# Patient Record
Sex: Female | Born: 1985
Health system: Southern US, Community
[De-identification: ages and names within clinical notes are randomized; demographics above are authoritative.]

## PROBLEM LIST (undated history)

## (undated) ENCOUNTER — Inpatient Hospital Stay (HOSPITAL_COMMUNITY): Payer: Self-pay

## (undated) DIAGNOSIS — F32A Depression, unspecified: Secondary | ICD-10-CM

## (undated) DIAGNOSIS — N83209 Unspecified ovarian cyst, unspecified side: Secondary | ICD-10-CM

## (undated) DIAGNOSIS — D649 Anemia, unspecified: Secondary | ICD-10-CM

## (undated) HISTORY — DX: Depression, unspecified: F32.A

## (undated) HISTORY — PX: NO PAST SURGERIES: SHX2092

---

## 2013-08-28 ENCOUNTER — Emergency Department: Payer: Self-pay | Admitting: Emergency Medicine

## 2015-08-05 ENCOUNTER — Ambulatory Visit: Payer: Self-pay | Admitting: Physician Assistant

## 2015-08-05 ENCOUNTER — Encounter: Payer: Self-pay | Admitting: Physician Assistant

## 2015-08-05 VITALS — BP 100/80 | HR 76 | Temp 98.2°F

## 2015-08-05 DIAGNOSIS — J018 Other acute sinusitis: Secondary | ICD-10-CM

## 2015-08-05 MED ORDER — AZITHROMYCIN 250 MG PO TABS: ORAL_TABLET | ORAL | Status: DC

## 2015-08-05 MED ORDER — FLUTICASONE PROPIONATE 50 MCG/ACT NA SUSP: 2.0000 | Freq: Every day | NASAL | Status: DC

## 2015-08-05 NOTE — Progress Notes (Signed)
S: C/o runny nose and congestion for 3 days, no fever, chills, cp/sob, v/d; mucus is yellow, boody, and thick, cough is sporadic, c/o of facial and dental pain.   Using otc meds:   O: PE: vitals wnl, nad, perrl eomi, normocephalic, tms dull, nasal mucosa red and swollen, throat injected, neck supple no lymph, lungs c t a, cv rrr, neuro intact  A:  Acute sinusitis   P: zpack, flonase, drink fluids, continue regular meds , use otc meds of choice, return if not improving in 5 days, return earlier if worsening

## 2015-09-06 DIAGNOSIS — Z6820 Body mass index (BMI) 20.0-20.9, adult: Secondary | ICD-10-CM | POA: Diagnosis not present

## 2015-09-06 DIAGNOSIS — F418 Other specified anxiety disorders: Secondary | ICD-10-CM | POA: Diagnosis not present

## 2015-10-29 DIAGNOSIS — F329 Major depressive disorder, single episode, unspecified: Secondary | ICD-10-CM | POA: Diagnosis not present

## 2015-10-29 DIAGNOSIS — G4700 Insomnia, unspecified: Secondary | ICD-10-CM | POA: Diagnosis not present

## 2015-10-29 DIAGNOSIS — Z681 Body mass index (BMI) 19 or less, adult: Secondary | ICD-10-CM | POA: Diagnosis not present

## 2015-10-29 DIAGNOSIS — F419 Anxiety disorder, unspecified: Secondary | ICD-10-CM | POA: Diagnosis not present

## 2016-02-09 DIAGNOSIS — Z8742 Personal history of other diseases of the female genital tract: Secondary | ICD-10-CM | POA: Diagnosis not present

## 2016-02-09 DIAGNOSIS — Z01419 Encounter for gynecological examination (general) (routine) without abnormal findings: Secondary | ICD-10-CM | POA: Diagnosis not present

## 2016-02-09 DIAGNOSIS — Z803 Family history of malignant neoplasm of breast: Secondary | ICD-10-CM | POA: Diagnosis not present

## 2016-02-09 DIAGNOSIS — F411 Generalized anxiety disorder: Secondary | ICD-10-CM | POA: Diagnosis not present

## 2016-02-09 DIAGNOSIS — Z30011 Encounter for initial prescription of contraceptive pills: Secondary | ICD-10-CM | POA: Diagnosis not present

## 2016-02-09 DIAGNOSIS — Z308 Encounter for other contraceptive management: Secondary | ICD-10-CM | POA: Diagnosis not present

## 2016-02-14 ENCOUNTER — Ambulatory Visit
Admission: RE | Admit: 2016-02-14 | Discharge: 2016-02-14 | Disposition: A | Discharge status: Home / Self Care | Payer: 59 | Source: Ambulatory Visit | Attending: Physician Assistant | Admitting: Physician Assistant

## 2016-02-14 ENCOUNTER — Ambulatory Visit: Payer: Self-pay | Admitting: Physician Assistant

## 2016-02-14 ENCOUNTER — Encounter: Payer: Self-pay | Admitting: Physician Assistant

## 2016-02-14 VITALS — BP 94/60 | HR 58 | Temp 98.2°F

## 2016-02-14 DIAGNOSIS — Z299 Encounter for prophylactic measures, unspecified: Secondary | ICD-10-CM

## 2016-02-14 DIAGNOSIS — R062 Wheezing: Secondary | ICD-10-CM | POA: Insufficient documentation

## 2016-02-14 LAB — POCT URINE PREGNANCY: Preg Test, Ur: NEGATIVE

## 2016-02-14 NOTE — Progress Notes (Signed)
S: wheezing, states her gyn doctor listened to her chest and said she had some wheezing in her lower left lung, denies cp/sob/ did have a cold about 2 weeks ago but no fever/chills at this time, ran 7 miles on Saturday, states she feels good, no hx of smoking, parents did not smoke, doesn't cough with laughter or extensive talking, no dif breathing, husband is very concerned  O: vitals wnl, nad, lungs c t a, cv rrr  A: hx of wheezing  P: urine preg in clinic, cxr ap and lateral

## 2016-02-24 DIAGNOSIS — R5383 Other fatigue: Secondary | ICD-10-CM | POA: Diagnosis not present

## 2016-02-24 DIAGNOSIS — R0609 Other forms of dyspnea: Secondary | ICD-10-CM | POA: Diagnosis not present

## 2016-02-24 DIAGNOSIS — Z6820 Body mass index (BMI) 20.0-20.9, adult: Secondary | ICD-10-CM | POA: Diagnosis not present

## 2016-02-24 DIAGNOSIS — R0689 Other abnormalities of breathing: Secondary | ICD-10-CM | POA: Diagnosis not present

## 2016-03-08 DIAGNOSIS — J45909 Unspecified asthma, uncomplicated: Secondary | ICD-10-CM | POA: Diagnosis not present

## 2016-03-08 DIAGNOSIS — Z681 Body mass index (BMI) 19 or less, adult: Secondary | ICD-10-CM | POA: Diagnosis not present

## 2016-03-20 NOTE — Progress Notes (Signed)
Patient was notified of normal cxr 02/16/2016

## 2016-09-14 DIAGNOSIS — H5203 Hypermetropia, bilateral: Secondary | ICD-10-CM | POA: Diagnosis not present

## 2017-01-02 ENCOUNTER — Ambulatory Visit: Payer: Self-pay | Admitting: Physician Assistant

## 2017-01-02 ENCOUNTER — Encounter: Payer: Self-pay | Admitting: Physician Assistant

## 2017-01-02 VITALS — BP 90/60 | HR 72 | Temp 97.9°F

## 2017-01-02 DIAGNOSIS — J069 Acute upper respiratory infection, unspecified: Secondary | ICD-10-CM

## 2017-01-02 MED ORDER — AZITHROMYCIN 250 MG PO TABS: ORAL_TABLET | ORAL | 0 refills | Status: DC

## 2017-01-02 MED ORDER — FLUTICASONE PROPIONATE 50 MCG/ACT NA SUSP: 2.0000 | Freq: Every day | NASAL | 6 refills | Status: DC

## 2017-01-02 NOTE — Progress Notes (Signed)
S: C/o runny nose and congestion and dry cough for 2 weeks, no fever, chills, but did feel warm last night; denies cp/sob, v/d; mucus is yellow  Using otc meds: dayqil/nyquil, cough drops  O: PE: vitals wnl, nad, perrl eomi, normocephalic, tms dull, nasal mucosa red and swollen, throat injected, neck supple no lymph, lungs c t a, cv rrr, neuro intact  A:  Acute  uri   P: drink fluids, continue regular meds , use otc meds of choice, return if not improving in 5 days, return earlier if worsening , zpack, flonase

## 2017-09-18 ENCOUNTER — Ambulatory Visit (INDEPENDENT_AMBULATORY_CARE_PROVIDER_SITE_OTHER): Payer: No Typology Code available for payment source | Admitting: Family Medicine

## 2017-09-18 ENCOUNTER — Encounter: Payer: Self-pay | Admitting: Family Medicine

## 2017-09-18 VITALS — BP 90/60 | HR 72 | Temp 97.6°F | Wt 129.4 lb

## 2017-09-18 DIAGNOSIS — Z862 Personal history of diseases of the blood and blood-forming organs and certain disorders involving the immune mechanism: Secondary | ICD-10-CM | POA: Diagnosis not present

## 2017-09-18 DIAGNOSIS — D509 Iron deficiency anemia, unspecified: Secondary | ICD-10-CM | POA: Diagnosis not present

## 2017-09-18 DIAGNOSIS — Z3169 Encounter for other general counseling and advice on procreation: Secondary | ICD-10-CM | POA: Diagnosis not present

## 2017-09-18 DIAGNOSIS — Z1159 Encounter for screening for other viral diseases: Secondary | ICD-10-CM | POA: Diagnosis not present

## 2017-09-18 NOTE — Progress Notes (Signed)
   CC: new patient  HPI  Referred by: [employer], [employer] employee. 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 Never tried IV drugs, experimented with other.  Anxiety, depression - currently using coping skills, no meds.  Eating disorder as a teenager. Purging.  - Diagnoses: Eating disorder, purging - Hospitalizations:  No  - Pharmacotherapy: can't recall, thinks zoloft - Outpatient therapy: in past, none now  Family history of psychiatric issues: none   Other: no trauma or violence (Consider trauma, interpersonal violence)   Surgical history: none  Multivitamin and iron occasionally - mostly vegan.  Last labs 1-2 years ago.   Social history:  Lives with: husband, coach of club soccer  Occupation: As above Tobacco use: tried as a teen Alcohol use: as above Drug use: as above  Sexual history:  Monogamous with husband Men in the past Never has any STIs  Birth control - regular periods, last OCP 1 year ago. Hx of bad cramps. Hx of normal PAPs. Last PAP within the last 3 years.   Mom w HER2 breast cancer   [facility] at [facility] 05/21/16.   ROS: Denies chest pain, shortness of breath, abdominal pain, dysuria, rash, change in bowel movements.  CC, SH/smoking status, and VS noted  Objective: BP 90/60 (BP Location: Right Arm, Patient Position: Sitting, Cuff Size: Normal)   Pulse 72   Temp 97.6 F (36.4 C) (Oral)   Wt 129 lb 6.4 oz (58.7 kg)   LMP 08/20/2017 (Approximate)   SpO2 98%  Gen: NAD, alert, cooperative, and pleasant thin female. HEENT: NCAT, EOMI, PERRL CV: RRR, no murmur Resp: CTAB, no wheezes, non-labored Abd: SNTND, BS present, no guarding or organomegaly Ext: No edema, warm Neuro: Alert and oriented, Speech clear, No gross deficits  Assessment and plan:  Encounter for preconception consultation Patient would desire  pregnancy within 1 year with her husband.  Has not been on birth control for 1 year, would not consider this past year as trying to get pregnant.  Counseled on beginning prenatal vitamins with folic acid as soon as possible, patient will do this.  Additionally, discussed vegan diet and no need to modify during pregnancy.  Suggested that patient return if pregnancy test is positive or she has other concerns.  History of iron deficiency anemia We will check CBC today, as patient reports history of iron deficiency anemia due to heavy menstruation in her past.   Orders Placed This Encounter  Procedures  . CBC  . HIV antibody    Ralene Ok, MD, PGY2 09/21/2017 11:58 AM

## 2017-09-18 NOTE — Patient Instructions (Addendum)
It was a pleasure to see you today! Thank you for choosing [facility] for your primary care. Allison King was seen for new patient appointment.   Our plans for today were:  Take your prenatal vitamin daily. It needs to have folic acid.   I will call you if your labs are abnormal.    If you become pregnant, we would be happy to see you here, or you are welcome to try another OB practice in town. I recommend the [facility] [employer] at [facility] or [address]   You should return to our clinic to see Dr. Chanetta Marshall in 1 year for physical or sooner if needed.   Best,  Dr. Chanetta Marshall   Preparing for Pregnancy If you are considering becoming pregnant, make an appointment to see your regular health care provider to learn how to prepare for a safe and healthy pregnancy (preconception care). During a preconception care visit, your health care provider will:  Do a complete physical exam, including a Pap test.  Take a complete medical history.  Give you information, answer your questions, and help you resolve problems.  Preconception checklist Medical history  Tell your health care provider about any current or past medical conditions. Your pregnancy or your ability to become pregnant may be affected by chronic conditions, such as diabetes, chronic hypertension, and thyroid problems.  Include your family's medical history as well as your partner's medical history.  Tell your health care provider about any history of STIs (sexually transmitted infections).These can affect your pregnancy. In some cases, they can be passed to your baby. Discuss any concerns that you have about STIs.  If indicated, discuss the benefits of genetic testing. This testing will show whether there are any genetic conditions that may be passed from you or your partner to your baby.  Tell your health care provider about: ? Any problems you have had with conception or  pregnancy. ? Any medicines you take. These include vitamins, herbal supplements, and over-the-counter medicines. ? Your history of immunizations. Discuss any vaccinations that you may need.  Diet  Ask your health care provider what to include in a healthy diet that has a balance of nutrients. This is especially important when you are pregnant or preparing to become pregnant.  Ask your health care provider to help you reach a healthy weight before pregnancy. ? If you are overweight, you may be at higher risk for certain complications, such as high blood pressure, diabetes, and preterm birth. ? If you are underweight, you are more likely to have a baby who has a low birth weight.  Lifestyle, work, and home  Let your health care provider know: ? About any lifestyle habits that you have, such as alcohol use, drug use, or smoking. ? About recreational activities that may put you at risk during pregnancy, such as downhill skiing and certain exercise programs. ? Tell your health care provider about any international travel, especially any travel to places with an active [place] virus outbreak. ? About harmful substances that you may be exposed to at work or at home. These include chemicals, pesticides, radiation, or even litter boxes. ? If you do not feel safe at home.  Mental health  Tell your health care provider about: ? Any history of mental health conditions, including feelings of depression, sadness, or anxiety. ? Any medicines that you take for a mental health condition. These include herbs and supplements.  Home instructions to prepare for pregnancy  Lifestyle  Eat a balanced diet. This includes fresh fruits and vegetables, whole grains, lean meats, low-fat dairy products, healthy fats, and foods that are high in fiber. Ask to meet with a nutritionist or registered dietitian for assistance with meal planning and goals.  Get regular exercise. Try to be active for at least 30 minutes a day  on most days of the week. Ask your health care provider which activities are safe during pregnancy.  Do not use any products that contain nicotine or tobacco, such as cigarettes and e-cigarettes. If you need help quitting, ask your health care provider.  Do not drink alcohol.  Do not take illegal drugs.  Maintain a healthy weight. Ask your health care provider what weight range is right for you.  General instructions  Keep an accurate record of your menstrual periods. This makes it easier for your health care provider to determine your baby's due date.  Begin taking prenatal vitamins and folic acid supplements daily as directed by your health care provider.  Manage any chronic conditions, such as high blood pressure and diabetes, as told by your health care provider. This is important.  How do I know that I am pregnant? You may be pregnant if you have been sexually active and you miss your period. Symptoms of early pregnancy include:  Mild cramping.  Very light vaginal bleeding (spotting).  Feeling unusually tired.  Nausea and vomiting (morning sickness).  If you have any of these symptoms and you suspect that you might be pregnant, you can take a home pregnancy test. These tests check for a hormone in your urine (human chorionic gonadotropin, or hCG). A woman's body begins to make this hormone during early pregnancy. These tests are very accurate. Wait until at least the first day after you miss your period to take one. If the test shows that you are pregnant (you get a positive result), call your health care provider to make an appointment for prenatal care. What should I do if I become pregnant?  Make an appointment with your health care provider as soon as you suspect you are pregnant.  Do not use any products that contain nicotine, such as cigarettes, chewing tobacco, and e-cigarettes. If you need help quitting, ask your health care provider.  Do not drink alcoholic  beverages. Alcohol is related to a number of birth defects.  Avoid toxic odors and chemicals.  You may continue to have sexual intercourse if it does not cause pain or other problems, such as vaginal bleeding. This information is not intended to replace advice given to you by your health care provider. Make sure you discuss any questions you have with your health care provider. Document Released: 07/20/2008 Document Revised: 04/04/2016 Document Reviewed: 02/27/2016 Elsevier Interactive Patient Education  [employer]2018 Elsevier Inc.

## 2017-09-19 LAB — CBC
HEMATOCRIT: 40.6 % (ref 34.0–46.6)
Hemoglobin: 12.9 g/dL (ref 11.1–15.9)
MCH: 30.4 pg (ref 26.6–33.0)
MCHC: 31.8 g/dL (ref 31.5–35.7)
MCV: 96 fL (ref 79–97)
Platelets: 226 10*3/uL (ref 150–379)
RBC: 4.24 x10E6/uL (ref 3.77–5.28)
RDW: 13.1 % (ref 12.3–15.4)
WBC: 7.3 10*3/uL (ref 3.4–10.8)

## 2017-09-19 LAB — HIV ANTIBODY (ROUTINE TESTING W REFLEX): HIV Screen 4th Generation wRfx: NONREACTIVE

## 2017-09-21 ENCOUNTER — Telehealth: Payer: Self-pay | Admitting: Family Medicine

## 2017-09-21 DIAGNOSIS — Z862 Personal history of diseases of the blood and blood-forming organs and certain disorders involving the immune mechanism: Secondary | ICD-10-CM | POA: Insufficient documentation

## 2017-09-21 DIAGNOSIS — Z3169 Encounter for other general counseling and advice on procreation: Secondary | ICD-10-CM | POA: Insufficient documentation

## 2017-09-21 NOTE — Telephone Encounter (Signed)
Called patient and left voicemail that labs are normal and she does not need any iron supplementation.  Left voicemail to call if questions.

## 2017-09-21 NOTE — Assessment & Plan Note (Signed)
Patient would desire pregnancy within 1 year with her husband.  Has not been on birth control for 1 year, would not consider this past year as trying to get pregnant.  Counseled on beginning prenatal vitamins with folic acid as soon as possible, patient will do this.  Additionally, discussed vegan diet and no need to modify during pregnancy.  Suggested that patient return if pregnancy test is positive or she has other concerns.

## 2017-09-21 NOTE — Assessment & Plan Note (Signed)
We will check CBC today, as patient reports history of iron deficiency anemia due to heavy menstruation in her past.

## 2017-12-03 ENCOUNTER — Emergency Department
Admission: EM | Admit: 2017-12-03 | Discharge: 2017-12-03 | Disposition: A | Discharge status: Home / Self Care | Payer: No Typology Code available for payment source | Attending: Emergency Medicine | Admitting: Emergency Medicine

## 2017-12-03 ENCOUNTER — Encounter: Payer: Self-pay | Admitting: Emergency Medicine

## 2017-12-03 ENCOUNTER — Other Ambulatory Visit: Payer: Self-pay

## 2017-12-03 DIAGNOSIS — R102 Pelvic and perineal pain: Secondary | ICD-10-CM | POA: Diagnosis not present

## 2017-12-03 DIAGNOSIS — Z79899 Other long term (current) drug therapy: Secondary | ICD-10-CM | POA: Diagnosis not present

## 2017-12-03 DIAGNOSIS — Z3202 Encounter for pregnancy test, result negative: Secondary | ICD-10-CM | POA: Diagnosis not present

## 2017-12-03 DIAGNOSIS — R1030 Lower abdominal pain, unspecified: Secondary | ICD-10-CM | POA: Diagnosis present

## 2017-12-03 HISTORY — DX: Unspecified ovarian cyst, unspecified side: N83.209

## 2017-12-03 LAB — URINALYSIS, COMPLETE (UACMP) WITH MICROSCOPIC
BACTERIA UA: NONE SEEN
BILIRUBIN URINE: NEGATIVE
Glucose, UA: NEGATIVE mg/dL
KETONES UR: NEGATIVE mg/dL
NITRITE: NEGATIVE
Protein, ur: 100 mg/dL — AB
SPECIFIC GRAVITY, URINE: 1.024 (ref 1.005–1.030)
pH: 8 (ref 5.0–8.0)

## 2017-12-03 LAB — COMPREHENSIVE METABOLIC PANEL
ALBUMIN: 4.4 g/dL (ref 3.5–5.0)
ALK PHOS: 105 U/L (ref 38–126)
ALT: 19 U/L (ref 14–54)
ANION GAP: 6 (ref 5–15)
AST: 29 U/L (ref 15–41)
BILIRUBIN TOTAL: 0.9 mg/dL (ref 0.3–1.2)
BUN: 15 mg/dL (ref 6–20)
CALCIUM: 8.7 mg/dL — AB (ref 8.9–10.3)
CO2: 27 mmol/L (ref 22–32)
Chloride: 104 mmol/L (ref 101–111)
Creatinine, Ser: 0.6 mg/dL (ref 0.44–1.00)
GLUCOSE: 108 mg/dL — AB (ref 65–99)
POTASSIUM: 4 mmol/L (ref 3.5–5.1)
Sodium: 137 mmol/L (ref 135–145)
TOTAL PROTEIN: 6.8 g/dL (ref 6.5–8.1)

## 2017-12-03 LAB — CBC
HEMATOCRIT: 39.4 % (ref 35.0–47.0)
HEMOGLOBIN: 13.6 g/dL (ref 12.0–16.0)
MCH: 32.4 pg (ref 26.0–34.0)
MCHC: 34.6 g/dL (ref 32.0–36.0)
MCV: 93.6 fL (ref 80.0–100.0)
Platelets: 202 10*3/uL (ref 150–440)
RBC: 4.21 MIL/uL (ref 3.80–5.20)
RDW: 12.9 % (ref 11.5–14.5)
WBC: 15 10*3/uL — AB (ref 3.6–11.0)

## 2017-12-03 LAB — POCT PREGNANCY, URINE: Preg Test, Ur: NEGATIVE

## 2017-12-03 LAB — LIPASE, BLOOD: Lipase: 35 U/L (ref 11–51)

## 2017-12-03 NOTE — Discharge Instructions (Addendum)
Please seek medical attention for any high fevers, chest pain, shortness of breath, change in behavior, persistent vomiting, bloody stool or any other new or concerning symptoms.  

## 2017-12-03 NOTE — ED Provider Notes (Signed)
[facility] Emergency Department Provider Note  ____________________________________________   I have reviewed the triage vital signs and the nursing notes.   HISTORY  Chief Complaint Abdominal Pain   History limited by: Not Limited   HPI Allison King is a 32 y.o. female who presents to the emergency department today because of concerns for lower abdominal pain.  Patient states the pain started suddenly.  It was severe.  Located in the bilateral lower abdomen.  It occurred while the patient was working out.  She denies doing any extraneous rectus exercises at that time.  She is currently menstruating and states she has had some normal cramping.  She was given medication by EMS and has had good resolution of the patient's symptoms.  She has had a similar symptoms in the past when she has had previous ovarian cyst.  She denies any recent illness.   Per medical record review patient has a history of ovarian cyst.  Past Medical History:  Diagnosis Date  . Ovarian cyst     Patient Active Problem List   Diagnosis Date Noted  . Encounter for preconception consultation 09/21/2017  . History of iron deficiency anemia 09/21/2017    History reviewed. No pertinent surgical history.  Prior to Admission medications   Medication Sig Start Date End Date Taking? Authorizing Provider  Prenatal Vit-Fe Fumarate-FA (PRENATAL MULTIVITAMIN) TABS tablet Take 1 tablet by mouth daily at 12 noon.    [provider]    Allergies Sulfa antibiotics  No family history on file.  Social History Social History   Tobacco Use  . Smoking status: Never Smoker  . Smokeless tobacco: Never Used  Substance Use Topics  . Alcohol use: Not on file  . Drug use: Not on file    Review of Systems Constitutional: No fever/chills Eyes: No visual changes. ENT: No sore throat. Cardiovascular: Denies chest pain. Respiratory: Denies shortness of  breath. Gastrointestinal: Positive for lower abdominal pain. Genitourinary: Negative for dysuria. Musculoskeletal: Negative for back pain. Skin: Negative for rash. Neurological: Negative for headaches, focal weakness or numbness.  ____________________________________________   PHYSICAL EXAM:  VITAL SIGNS: ED Triage Vitals  Enc Vitals Group     BP 12/03/17 1350 (!) 103/52     Pulse Rate 12/03/17 1350 71     Resp 12/03/17 1350 16     Temp 12/03/17 1350 98 F (36.7 C)     Temp Source 12/03/17 1350 Oral     SpO2 12/03/17 1350 100 %     Weight 12/03/17 1348 125 lb (56.7 kg)     Height 12/03/17 1348 5\' 5"  (1.651 m)     Head Circumference --      Peak Flow --      Pain Score 12/03/17 1348 0    Constitutional: Alert and oriented. Well appearing and in no distress. Eyes: Conjunctivae are normal.  ENT   Head: Normocephalic and atraumatic.   Nose: No congestion/rhinnorhea.   Mouth/Throat: Mucous membranes are moist.   Neck: No stridor. Hematological/Lymphatic/Immunilogical: No cervical lymphadenopathy. Cardiovascular: Normal rate, regular rhythm.  No murmurs, rubs, or gallops.  Respiratory: Normal respiratory effort without tachypnea nor retractions. Breath sounds are clear and equal bilaterally. No wheezes/rales/rhonchi. Gastrointestinal: Soft and non tender. No rebound. No guarding.  Genitourinary: Deferred Musculoskeletal: Normal range of motion in all extremities.  Neurologic:  Normal speech and language. No gross focal neurologic deficits are appreciated.  Skin:  Skin is warm, dry and intact. No rash noted. Psychiatric: Mood and affect  are normal. Speech and behavior are normal. Patient exhibits appropriate insight and judgment.  ____________________________________________    LABS (pertinent positives/negatives)  Upreg negative UA too numerous to count RBC, wbc 0-5, trace leukocytes Lipase 35 CMP na 137, k 4.0, cr 0.60 CBC wbc 15.0, hgb 13.6, plt  202 ____________________________________________   EKG  None  ____________________________________________    RADIOLOGY  None  ____________________________________________   PROCEDURES  Procedures  ____________________________________________   INITIAL IMPRESSION / ASSESSMENT AND PLAN / ED COURSE  Pertinent labs & imaging results that were available during my care of the patient were reviewed by me and considered in my medical decision making (see chart for details).  Patient presented to the emergency department today with concerns for lower abdominal pain that started while she was exercised.  At the time my examination the pain had resolved.  Abdomen was nontender.  States she has a history of ovarian cyst in the past and is currently menstruating.  Did discuss possibility of ovarian cyst the patient.  Also discussed possibility of ovarian torsion.  Did offer ultrasound however patient deferred at this time.  I think this is reasonable given resolution of the patient's symptoms and benign exam.  We did discuss return precautions.  ______________________________________   FINAL CLINICAL IMPRESSION(S) / ED DIAGNOSES  Final diagnoses:  Pelvic pain in female     Note: This dictation was prepared with Dragon dictation. Any transcriptional errors that result from this process are unintentional     Phineas SemenGoodman, Tanajah Boulter, MD 12/03/17 1544

## 2017-12-03 NOTE — ED Triage Notes (Signed)
C/O vaginal bleeding and lower abdominal pain, onset of symptoms this morning.  Patient states she has had similar symptoms when she had a ruptured ovarian cyst in the past.  Last about 5 years ago.  ARrived by EMS and was given fentanyl and zofran, which has resolved pain.

## 2017-12-03 NOTE — ED Notes (Signed)
Pain bilat low abd today while working out.  Sharp.  Feels like cysts felt in past.  Menses now.  Allison King is about a 1 now.  Got meds on ambulance.

## 2017-12-05 ENCOUNTER — Encounter: Payer: Self-pay | Admitting: Emergency Medicine

## 2018-01-09 ENCOUNTER — Ambulatory Visit (INDEPENDENT_AMBULATORY_CARE_PROVIDER_SITE_OTHER): Payer: No Typology Code available for payment source | Admitting: Obstetrics & Gynecology

## 2018-01-09 ENCOUNTER — Ambulatory Visit (INDEPENDENT_AMBULATORY_CARE_PROVIDER_SITE_OTHER): Payer: No Typology Code available for payment source | Admitting: Clinical

## 2018-01-09 ENCOUNTER — Encounter: Payer: Self-pay | Admitting: Obstetrics & Gynecology

## 2018-01-09 VITALS — BP 101/67 | HR 56 | Resp 16 | Ht 65.0 in | Wt 125.8 lb

## 2018-01-09 DIAGNOSIS — Z1151 Encounter for screening for human papillomavirus (HPV): Secondary | ICD-10-CM

## 2018-01-09 DIAGNOSIS — Z124 Encounter for screening for malignant neoplasm of cervix: Secondary | ICD-10-CM

## 2018-01-09 DIAGNOSIS — N941 Unspecified dyspareunia: Secondary | ICD-10-CM | POA: Diagnosis not present

## 2018-01-09 DIAGNOSIS — Z113 Encounter for screening for infections with a predominantly sexual mode of transmission: Secondary | ICD-10-CM

## 2018-01-09 DIAGNOSIS — Z3202 Encounter for pregnancy test, result negative: Secondary | ICD-10-CM

## 2018-01-09 DIAGNOSIS — F4322 Adjustment disorder with anxiety: Secondary | ICD-10-CM | POA: Diagnosis not present

## 2018-01-09 DIAGNOSIS — N926 Irregular menstruation, unspecified: Secondary | ICD-10-CM

## 2018-01-09 DIAGNOSIS — B373 Candidiasis of vulva and vagina: Secondary | ICD-10-CM | POA: Diagnosis not present

## 2018-01-09 DIAGNOSIS — F331 Major depressive disorder, recurrent, moderate: Secondary | ICD-10-CM | POA: Diagnosis not present

## 2018-01-09 DIAGNOSIS — B3731 Acute candidiasis of vulva and vagina: Secondary | ICD-10-CM

## 2018-01-09 LAB — POCT URINE PREGNANCY: Preg Test, Ur: NEGATIVE

## 2018-01-09 NOTE — Patient Instructions (Signed)
Return to clinic for any scheduled appointments or for any gynecologic concerns as needed.   

## 2018-01-09 NOTE — BH Specialist Note (Signed)
Integrated Behavioral Health Initial Visit  MRN: [id number] Name: Tyriana Helmkamp  Number of Integrated Behavioral Health Clinician visits:: 1/6 Session Start time: 3:10  Session End time: 3:33 Total time: 20 minutes  Type of Service: Integrated Behavioral Health- Individual/Family Interpretor:No. Interpretor Name and Language: n/a   Warm Hand Off Completed.       SUBJECTIVE: Paylin Hailu is a 32 y.o. female accompanied by n/a Patient was referred by Dr Macon Large for anxiety. Patient reports the following symptoms/concerns: Pt states her primary concern today is anxiety and lack of quality sleep, attributed to adjusting to a new location(next to sound of train), new work position, and goal of pregnancy; pt uses daily meditation and physical exercise to cope.  Duration of problem: Ongoing; Severity of problem: moderate  OBJECTIVE: Mood: Anxious and Affect: Appropriate Risk of harm to self or others: No plan to harm self or others  LIFE CONTEXT: Family and Social: Pt lives with husband. School/Work: Pt works fulltime as Stage manager: Daily meditation and physical exercise Life Changes: Move to new location; new work position  GOALS ADDRESSED: Patient will: 1. Reduce symptoms of: anxiety 2. Increase knowledge and/or ability of: self-management skills  3. Demonstrate ability to: Increase healthy adjustment to current life circumstances  INTERVENTIONS: Interventions utilized: Mindfulness or Management consultant and Psychoeducation and/or Health Education  Standardized Assessments completed: GAD-7 and PHQ 9  ASSESSMENT: Patient currently experiencing Adjustment disorder with anxious mood.   Patient may benefit from psychoeducation and brief therapeutic interventions regarding coping with symptoms of anxiety .  PLAN: 1. Follow up with behavioral health clinician on : As needed 2. Behavioral recommendations:  -Continue using daily meditation and  physical exercise; consider sleep and meditation apps as additional self-coping strategies -Consider returning to EACP services  -Read educational materials regarding coping with symptoms of anxiety  3. Referral(s): Integrated [employer] (In Clinic) 4. "From scale of 1-10, how likely are you to follow plan?": 10  Rae Lips, LCSW   Depression screen [facility] 2/9 01/09/2018 09/18/2017  Decreased Interest 1 0  Down, Depressed, Hopeless 2 0  PHQ - 2 Score 3 0  Altered sleeping 2 -  Tired, decreased energy 2 -  Change in appetite 0 -  Feeling bad or failure about yourself  0 -  Trouble concentrating 2 -  Moving slowly or fidgety/restless 0 -  Suicidal thoughts 0 -  PHQ-9 Score 9 -  Difficult doing work/chores Somewhat difficult -   GAD 7 : Generalized Anxiety Score 01/09/2018  Nervous, Anxious, on Edge 3  Control/stop worrying 3  Worry too much - different things 3  Trouble relaxing 3  Restless 2  Easily annoyed or irritable 1  Afraid - awful might happen 0  Total GAD 7 Score 15  Anxiety Difficulty Extremely difficult

## 2018-01-09 NOTE — Progress Notes (Signed)
GYNECOLOGY OFFICE VISIT NOTE  History:  32 y.o. G0 here today for evaluation of irregular menstrual periods.  Last months, had two periods two weeks apart. Associated with nausea in the last two weeks. Feels very tired, depressed, low; history of depression exacerbated in last month.  Occasionally has dyspareunia.  Negative UPT at home.  She denies any abnormal vaginal discharge, bleeding, pelvic pain or other concerns.   Past Medical History:  Diagnosis Date  . Ovarian cyst     History reviewed. No pertinent surgical history.  The following portions of the patient's history were reviewed and updated as appropriate: allergies, current medications, past family history, past medical history, past social history, past surgical history and problem list.   Health Maintenance:  Normal pap in the last 3 years.   Review of Systems:  Pertinent items noted in HPI and remainder of comprehensive ROS otherwise negative.  Objective:  Physical Exam BP 101/67 (BP Location: Left Arm, Patient Position: Sitting, Cuff Size: Large)   Pulse (!) 56   Resp 16   Ht  (1.651 m)   Wt 125 lb 12.8 oz (57.1 kg)   LMP 12/26/2017 (Exact Date)   BMI 20.93 kg/m  CONSTITUTIONAL: Well-developed, well-nourished female in no acute distress.  HENT:  Normocephalic, atraumatic. External right and left ear normal. Oropharynx is clear and moist EYES: Conjunctivae and EOM are normal. Pupils are equal, round, and reactive to light. No scleral icterus.  NECK: Normal range of motion, supple, no masses SKIN: Skin is warm and dry. No rash noted. Not diaphoretic. No erythema. No pallor. NEUROLOGIC: Alert and oriented to person, place, and time. Normal reflexes, muscle tone coordination. No cranial nerve deficit noted. PSYCHIATRIC: Normal mood and affect. Normal behavior. Normal judgment and thought content. CARDIOVASCULAR: Normal heart rate noted RESPIRATORY: Effort and breath sounds normal, no problems with respiration  noted ABDOMEN: Soft, no distention noted.   PELVIC: Normal appearing external genitalia; normal appearing vaginal mucosa and cervix.  No abnormal discharge noted. Pap smear done.  Normal uterine size, no other palpable masses, no uterine or adnexal tenderness. MUSCULOSKELETAL: Normal range of motion. No edema noted.  Labs and Imaging Results for orders placed or performed in visit on 01/09/18 (from the past 24 hour(s))  POCT urine pregnancy     Status: Normal   Collection Time: 01/09/18  2:43 PM  Result Value Ref Range   Preg Test, Ur Negative Negative     Assessment & Plan:  1. Irregular menses Patient has abnormal uterine bleeding . She has a normal exam, no evidence of lesions.  Will order abnormal uterine bleeding evaluation labs and pelvic ultrasound to evaluate for any structural gynecologic abnormalities.  Will contact patient with these results and plans for further evaluation/management. - POCT urine pregnancy - CBC - TSH - US PELVIC COMPLETE WITH TRANSVAGINAL; Future - Cervicovaginal ancillary only - Cytology - PAP  2. Dyspareunia in female - US PELVIC COMPLETE WITH TRANSVAGINAL; Future - Cervicovaginal ancillary only  3. Moderate episode of recurrent major depressive disorder [facility]) Patient verbally consented to meet with [facility] Clinician about presenting concerns. Will follow up recommendations. - Amb ref to Integrated Behavioral [facility]   Routine preventative health maintenance measures emphasized. Please refer to After Visit Summary for other counseling recommendations.   Return if symptoms worsen or fail to improve.   Total face-to-face time with patient: 20 minutes.  Over 50% of encounter was spent on counseling and coordination of care.   Jaynie Collins,  MD, [place], [facility] for [employer], [place]

## 2018-01-10 ENCOUNTER — Telehealth: Payer: Self-pay | Admitting: *Deleted

## 2018-01-10 LAB — TSH: TSH: 1.3 u[IU]/mL (ref 0.450–4.500)

## 2018-01-10 LAB — CBC
HEMATOCRIT: 39.1 % (ref 34.0–46.6)
Hemoglobin: 13.2 g/dL (ref 11.1–15.9)
MCH: 31.4 pg (ref 26.6–33.0)
MCHC: 33.8 g/dL (ref 31.5–35.7)
MCV: 93 fL (ref 79–97)
Platelets: 251 10*3/uL (ref 150–450)
RBC: 4.21 x10E6/uL (ref 3.77–5.28)
RDW: 13 % (ref 12.3–15.4)
WBC: 6.7 10*3/uL (ref 3.4–10.8)

## 2018-01-10 LAB — CERVICOVAGINAL ANCILLARY ONLY
BACTERIAL VAGINITIS: NEGATIVE
Candida vaginitis: POSITIVE — AB
Chlamydia: NEGATIVE
Neisseria Gonorrhea: NEGATIVE
TRICH (WINDOWPATH): NEGATIVE

## 2018-01-10 MED ORDER — FLUCONAZOLE 150 MG PO TABS: 150.0000 mg | ORAL_TABLET | Freq: Once | ORAL | 3 refills | Status: AC

## 2018-01-10 NOTE — Telephone Encounter (Signed)
-----   Message from Tereso Newcomer, MD sent at 01/10/2018  2:56 PM EDT ----- Vaginal discharge test is abnormal and showed yeast infection. Diflucan was prescribed. Please inform patient of results and advise to pick up prescription.

## 2018-01-10 NOTE — Addendum Note (Signed)
Addended by: Jaynie Collins A on: 01/10/2018 02:56 PM   Modules accepted: Orders

## 2018-01-10 NOTE — Telephone Encounter (Signed)
I called Allison King and left a message I am calling with some non urgent results - please call our office during office hours.

## 2018-01-11 ENCOUNTER — Telehealth: Payer: Self-pay | Admitting: Clinical

## 2018-01-11 LAB — CYTOLOGY - PAP
Diagnosis: NEGATIVE
HPV: NOT DETECTED

## 2018-01-11 NOTE — Telephone Encounter (Signed)
Follow-up with patient, as agreed-upon by pt and [facility], to notify pt of additional local support to match patient needs.

## 2018-01-15 NOTE — Telephone Encounter (Signed)
Mychart message sent to patient regarding result and recommendation.

## 2018-01-17 ENCOUNTER — Encounter: Payer: Self-pay | Admitting: Obstetrics & Gynecology

## 2018-01-17 ENCOUNTER — Ambulatory Visit (HOSPITAL_COMMUNITY)
Admission: RE | Admit: 2018-01-17 | Discharge: 2018-01-17 | Disposition: A | Discharge status: Home / Self Care | Payer: No Typology Code available for payment source | Source: Ambulatory Visit | Attending: Obstetrics & Gynecology | Admitting: Obstetrics & Gynecology

## 2018-01-17 DIAGNOSIS — N941 Unspecified dyspareunia: Secondary | ICD-10-CM | POA: Insufficient documentation

## 2018-01-17 DIAGNOSIS — N926 Irregular menstruation, unspecified: Secondary | ICD-10-CM | POA: Diagnosis not present

## 2018-01-21 ENCOUNTER — Ambulatory Visit: Payer: Self-pay

## 2018-01-22 ENCOUNTER — Ambulatory Visit (INDEPENDENT_AMBULATORY_CARE_PROVIDER_SITE_OTHER): Payer: No Typology Code available for payment source

## 2018-01-22 DIAGNOSIS — Z23 Encounter for immunization: Secondary | ICD-10-CM | POA: Diagnosis not present

## 2018-01-22 NOTE — Progress Notes (Signed)
   Patient in to nurse clinic for TDaP vaccine. Vaccine given right deltoid. Patient tolerated well.  Ples SpecterAlisa Misty Foutz, RN [facility] RN)

## 2018-04-17 ENCOUNTER — Ambulatory Visit (INDEPENDENT_AMBULATORY_CARE_PROVIDER_SITE_OTHER): Payer: No Typology Code available for payment source | Admitting: Family Medicine

## 2018-04-17 ENCOUNTER — Ambulatory Visit (HOSPITAL_COMMUNITY)
Admission: RE | Admit: 2018-04-17 | Discharge: 2018-04-17 | Disposition: A | Discharge status: Home / Self Care | Payer: No Typology Code available for payment source | Source: Ambulatory Visit | Attending: Family Medicine | Admitting: Family Medicine

## 2018-04-17 ENCOUNTER — Other Ambulatory Visit: Payer: Self-pay

## 2018-04-17 VITALS — BP 102/60 | HR 64 | Temp 98.7°F | Ht 65.0 in | Wt 124.0 lb

## 2018-04-17 DIAGNOSIS — M79672 Pain in left foot: Secondary | ICD-10-CM | POA: Insufficient documentation

## 2018-04-17 NOTE — Patient Instructions (Signed)
It was great seeing you today! We have addressed the following issues today  1. We will do an Xray of your foot today and I will follow up on the results 2. Make an appointment to follow up with Dr.Timberlake in two weeks for reevaluation. 3. Continue conservative management with rest, ice, and ibuprofen as needed.  If we did any lab work today, and the results require attention, either me or my nurse will get in touch with you. If everything is normal, you will get a letter in mail and a message via . If you don't hear from us in two weeks, please give us a call. Otherwise, we look forward to seeing you again at your next visit. If you have any questions or concerns before then, please call the clinic at [phone].  Please bring all your medications to every doctors visit  Sign up for My Chart to have easy access to your labs results, and communication with your Primary care physician. Please ask Front Desk for some assistance.   Please check-out at the front desk before leaving the clinic.    Take Care,   Dr. Sydnee Cabaliallo

## 2018-04-17 NOTE — Progress Notes (Signed)
   Subjective:    Patient ID: Allison King, female    DOB: Apr 10, 1986, 32 y.o.   MRN: [id number]   CC: Left foot pain   HPI: Patient is a 32 yo female who presents today complaining of left foot pain for the pas three weeks. Patient reports she first started to experience left foot pain while running mostly located on the plantar aspect of her forefoot and on the lateral aspect around the fifth MTP. Patient is an avid runner about 20 miles a week. Patient denies any tenderness to her ankle of medial aspect of her foot. No trauma and injury. She reports she just bought new insoles as her previous ones were worn out. She rated her pain 7/10 three weeks ago but it has gradually improve with rest ( two and half weeks), icing and ibuprofen as needed. No pain while walking, no pain with first step in the morning. Denies any swelling but describes the initial feeling as " having to pop something in my foot" on the dorsal aspect around the 5th metatarsal.   Smoking status reviewed   ROS: all other systems were reviewed and are negative other than in the HPI   Past Medical History:  Diagnosis Date  . Ovarian cyst     History reviewed. No pertinent surgical history.  Past medical history, surgical, family, and social history reviewed and updated in the EMR as appropriate.  Objective:  BP 102/60   Pulse 64   Temp 98.7 F (37.1 C) (Oral)   Ht 5\' 5"  (1.651 m)   Wt 124 lb (56.2 kg)   SpO2 98%   BMI 20.63 kg/m   Vitals and nursing note reviewed  General: NAD, pleasant, able to participate in exam Cardiac: RRR, normal heart sounds, no murmurs. 2+ radial and PT pulses bilaterally Respiratory: CTAB, normal effort, No wheezes, rales or rhonchi Abdomen: soft, nontender, nondistended, no hepatic or splenomegaly, +BS Left Foot Exam: No swelling, deformities, erythema noted on exam. Mildly tender to palpation around the fifth MTP and plantar aspect between toes 3 and 4. Normal dorsi and  plantar flexion. Sensation is intact. Good pulses. Ankle with good range of motion Right foot exam within normal limits Skin: warm and dry, no rashes noted Neuro: alert and oriented x4, no focal deficits Psych: Normal affect and mood   Assessment & Plan:   Left foot pain Patient presents with left foot pain mostly on the lateral aspect of her foot with also some tenderness noted on the plantar surface between toes 3-4. Pain is most noticeable while running around the fifth MTP. No nodule palpated on the plantar that would be concerning for morton neuroma. Possible stress fracture, unlikely to be lisfranc though she reports some dorsal tenderness initially. No pain with ambulation, do not think patient need immobilization of her foot. Symptoms not consistent with plantar fasciitis.   --Order foot X ray 3 views. --Continue rest, ice and NSAIDs as needed.  --If she resume running, better insole, flatter surface, gradual increase.  --Follow up in two weeks for reevaluation.   Lovena NeighboursAbdoulaye Alessandria Henken, MD [facility] PGY-3

## 2018-04-17 NOTE — Assessment & Plan Note (Addendum)
Patient presents with left foot pain mostly on the lateral aspect of her foot with also some tenderness noted on the plantar surface between toes 3-4. Pain is most noticeable while running around the fifth MTP. No nodule palpated on the plantar that would be concerning for morton neuroma. Possible stress fracture, unlikely to be lisfranc though she reports some dorsal tenderness initially. No pain with ambulation, do not think patient need immobilization of her foot. Symptoms not consistent with plantar fasciitis.   --Order foot X ray 3 views. --Continue rest, ice and NSAIDs as needed.  --If she resume running, better insole, flatter surface, gradual increase.  --Follow up in two weeks for reevaluation.

## 2018-05-10 ENCOUNTER — Ambulatory Visit: Payer: Self-pay | Admitting: Family Medicine

## 2018-09-06 ENCOUNTER — Encounter: Payer: Self-pay | Admitting: Family Medicine

## 2018-09-06 ENCOUNTER — Other Ambulatory Visit: Payer: Self-pay

## 2018-09-06 ENCOUNTER — Ambulatory Visit (INDEPENDENT_AMBULATORY_CARE_PROVIDER_SITE_OTHER): Payer: No Typology Code available for payment source | Admitting: Family Medicine

## 2018-09-06 VITALS — BP 95/58 | HR 63 | Temp 97.9°F | Wt 123.0 lb

## 2018-09-06 DIAGNOSIS — F411 Generalized anxiety disorder: Secondary | ICD-10-CM

## 2018-09-06 DIAGNOSIS — F322 Major depressive disorder, single episode, severe without psychotic features: Secondary | ICD-10-CM | POA: Insufficient documentation

## 2018-09-06 MED ORDER — NORTRIPTYLINE HCL 10 MG PO CAPS: 10.0000 mg | ORAL_CAPSULE | Freq: Every day | ORAL | 1 refills | Status: DC

## 2018-09-06 MED FILL — NORTRIPTYLINE HCL 10 MG CAP: 10 | 30 days supply | Qty: 30 | Fill #0

## 2018-09-06 NOTE — Progress Notes (Signed)
Subjective:    Allison King - 33 y.o. female MRN [id number]  Date of birth: [birth year]  CC:  Allison King is here to discuss her depression and anxiety.  HPI: Depression and anxiety - has been feeling depressed for the last 8 months, but has noticed an impact on her work her during the last month - has had trouble with sleep, which is the main reason why she is here - sees a therapist, which is very helpful for her - also has a strong support network including a very supportive partner and empathetic friends - has tried trazodone in the past when she was a teenager and cannot say whether this was very helpful for her since she was struggling with drug and alcohol addiction at that time - uses exercise to cope, but this is difficult when she cannot sleep as well - nervous to start medications, especially long-term, daily medications that will take a while to have an effect, and 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 INFLUENZA VACCINE  03/21/2018    -  reports that she has never smoked. She has never used smokeless tobacco. - Review of Systems: Per HPI. - Past Medical History: Patient Active Problem List   Diagnosis Date Noted  . Current severe episode of major depressive disorder without psychotic features without prior episode (HCC) 09/06/2018  . GAD (generalized anxiety disorder) 09/06/2018  . History of iron deficiency anemia 09/21/2017   - Medications: reviewed and updated   Objective:   Physical Exam BP (!) 95/58   Pulse 63   Temp 97.9  F (36.6 C) (Oral)   Wt 123 lb (55.8 kg)   LMP 07/23/2018   SpO2 99%   BMI 20.47 kg/m  Gen: NAD, alert, cooperative with exam, well-appearing HEENT: NCAT, PERRL, clear conjunctiva, oropharynx clear, supple neck CV: RRR, good S1/S2, no murmur, no edema, capillary refill brisk  Resp: CTABL, no wheezes, non-labored Abd: SNTND, BS present, no guarding or organomegaly Skin: no rashes, normal turgor  Neuro: no gross deficits.  Psych: good insight, alert and oriented  Depression screen PHQ 2/9 09/06/2018  Decreased Interest 3  Down, Depressed, Hopeless 3  PHQ - 2 Score 6  Altered sleeping 3  Tired, decreased energy 3  Change in appetite 2  Feeling bad or failure about yourself  3  Trouble concentrating 3  Moving slowly or fidgety/restless 2  Suicidal thoughts 1  PHQ-9 Score 23  Difficult doing work/chores Very difficult   GAD 7 : Generalized Anxiety Score 09/06/2018 01/09/2018  Nervous, Anxious, on Edge 3 3  Control/stop worrying 3 3  Worry too much - different things 3 3  Trouble relaxing 3 3  Restless 3 2  Easily annoyed or irritable 3 1  Afraid - awful might happen 0 0  Total GAD 7 Score 18 15  Anxiety Difficulty Very difficult Extremely difficult          Assessment & Plan:   Current severe episode of major depressive disorder without psychotic features without  prior episode [facility]) Patient is currently doing several lifestyle modifications and seeing a therapist for her depression, but it remains uncontrolled.  Patient is requesting a medicine to help her sleep for a short duration of time, so we agreed to try nortriptyline 10 mg nightly.  This is the lowest dose of this medication, so I told patient that we can increase this dose if she needs in the future.  Asked patient to consider an SSRI in the future as well.  Spoke at length with her regarding her suicidal thoughts, but she does not have a plan and does not think she would ever make a plan, so this was not an  immediate concern.  GAD (generalized anxiety disorder) Please see above plan.  Hopefully, nortriptyline will improve her sleep and she will be able to better cope with her anxiety due to improved sleep quality.  She may benefit from an SSRI in the future.    Lezlie Octave, M.D. 09/06/2018, 4:49 PM PGY-2, [facility]

## 2018-09-06 NOTE — Patient Instructions (Addendum)
It was nice meeting you today Ms. Bryant-Pardini!  We are going to start a medication called nortriptyline to help with your sleep.  We are starting the lowest dose, so if it does not seem to be helpful, please call me.  Please continue to see your therapist and get exercise.  Good luck with your job transition.  If you have any questions or concerns, please feel free to call the clinic.   Be well,  Dr. Frances Furbish

## 2018-09-06 NOTE — Assessment & Plan Note (Signed)
Please see above plan.  Hopefully, nortriptyline will improve her sleep and she will be able to better cope with her anxiety due to improved sleep quality.  She may benefit from an SSRI in the future.

## 2018-09-06 NOTE — Assessment & Plan Note (Signed)
Patient is currently doing several lifestyle modifications and seeing a therapist for her depression, but it remains uncontrolled.  Patient is requesting a medicine to help her sleep for a short duration of time, so we agreed to try nortriptyline 10 mg nightly.  This is the lowest dose of this medication, so I told patient that we can increase this dose if she needs in the future.  Asked patient to consider an SSRI in the future as well.  Spoke at length with her regarding her suicidal thoughts, but she does not have a plan and does not think she would ever make a plan, so this was not an immediate concern.

## 2018-09-25 ENCOUNTER — Ambulatory Visit (INDEPENDENT_AMBULATORY_CARE_PROVIDER_SITE_OTHER): Payer: No Typology Code available for payment source | Admitting: Family Medicine

## 2018-09-25 ENCOUNTER — Encounter: Payer: Self-pay | Admitting: Family Medicine

## 2018-09-25 ENCOUNTER — Other Ambulatory Visit: Payer: Self-pay

## 2018-09-25 VITALS — BP 98/60 | HR 92 | Temp 98.3°F | Ht 65.0 in | Wt 125.2 lb

## 2018-09-25 DIAGNOSIS — G479 Sleep disorder, unspecified: Secondary | ICD-10-CM | POA: Diagnosis not present

## 2018-09-25 DIAGNOSIS — F3341 Major depressive disorder, recurrent, in partial remission: Secondary | ICD-10-CM | POA: Diagnosis not present

## 2018-09-25 MED ORDER — HYDROXYZINE HCL 25 MG PO TABS: 25.0000 mg | ORAL_TABLET | Freq: Every evening | ORAL | 0 refills | Status: DC | PRN

## 2018-09-25 NOTE — Progress Notes (Signed)
   CC: mood  HPI  Mood - picked up the TCA. Tried it for 2 nights. Felt some nausea the fist night,. She was not in favor of the antidepressant idea of this. Has been trying readying before bed. Consistent time. Trying metalton which has helped some. Still wakes up at 2-3am most days. Is able to fall back asleep after this though. Exercising int eh mroning. Has been having some more work stress. She is changing jobs which she feels is helping. Husband is helping. Has been on zoloft and prozac as a teen. Feels like she was numb on SSRIs 4-5 years ago. Seeing [facility].     Office Visit from 09/25/2018 in [facility]  PHQ-9 Total Score  12     GAD 7 : Generalized Anxiety Score 09/25/2018 09/06/2018 01/09/2018  Nervous, Anxious, on Edge 2 3 3   Control/stop worrying 2 3 3   Worry too much - different things 2 3 3   Trouble relaxing 2 3 3   Restless 1 3 2   Easily annoyed or irritable 2 3 1   Afraid - awful might happen 0 0 0  Total GAD 7 Score 11 18 15   Anxiety Difficulty Very difficult Very difficult Extremely difficult      ROS: Denies CP, SOB, abdominal pain, dysuria, changes in BMs.   CC, SH/smoking status, and VS noted  Objective: BP 98/60   Pulse 92   Temp 98.3 F (36.8 C) (Oral)   Ht 5\' 5"  (1.651 m)   Wt 125 lb 3.2 oz (56.8 kg)   LMP 09/10/2018 (Exact Date)   SpO2 97%   BMI 20.83 kg/m  Gen: NAD, alert, cooperative, and pleasant. HEENT: NCAT, EOMI, PERRL CV: RRR, no murmur Resp: CTAB, no wheezes, non-labored Ext: No edema, warm Neuro: Alert and oriented, Speech clear, No gross deficits  Assessment and plan:  Depression -definitely feels that she is getting benefit from counseling, would love her to continue this.  We had a long discussion about various different types of mood medicines and the ones that she has previously not done well on which include Prozac, sertraline, Lexapro.  She is quite hesitant to start a "antidepressant," but I counseled her that the  fact that she has needed mood treatment off and on for the last 10 years or so and it seems as though her mood is impairing her daily function, she would be a great candidate for a mood medication.  She is also concerned about her sleep, which I report to her may improve if her depression is fully treated.  In the short-term, we will try Vistaril for sleep nightly as needed, and we will also continue to discuss mood medicines.  My next choice for her would be venlafaxine, and I gave her information about this particular medicine to consider.  She also states that she will talk to her counselor this week about what may be giving her a mental hang up about starting an antidepressant.  She notes continued passive suicidal ideation, which has become less common over the last month.  She knows to reach out if she has any escalation in the symptoms.  Loni Muse, MD, PGY3 09/25/2018 9:48 AM

## 2018-09-25 NOTE — Patient Instructions (Signed)
It was a pleasure to see you today! Thank you for choosing [facility] for your primary care. Allison King was seen for mood, sleep.   Our plans for today were:  You will ask your therapist about what might be causing your hesitation with starting mood medicine.   Try the vistaril for sleep.   Consider a medicine called venlafaxine or effexor for mood in the future.    Best,  Dr. Chanetta Marshall

## 2018-11-15 ENCOUNTER — Telehealth: Payer: Self-pay | Admitting: Family Medicine

## 2018-11-15 NOTE — Telephone Encounter (Signed)
Agree with plan 

## 2018-11-15 NOTE — Telephone Encounter (Signed)
Patient called for concerns of SOB. Stated that symptoms began 1 week ago with SOB with exertion. States that she is normally very athletic, can run 3 miles without any difficulty, but now has been having difficulty even running 1 mile and has now noticed SOB with walking up a flight of stairs. Patient's spouse told her she should call in, patient does not like to see doctors if possible. Patient denies fever or cough. Does report a chest tightness when SOB occurs. Denies edema. Otherwise healthy without significant health problems. FHx significant for breast cancer in her mother. Denies wheezing. Denies sick contacts. No recent travel. No birth control. Is not sedentary. Over the phone patient is speaking full sentences without distress, however, she is not exerting herself.   Given that patient is normally very athletic with acute change in respiratory status I believe patient needs to be evaluated today. Unfortunately we are unable to obtain outpatient CXR at this present time or provide COVID testing. Recommended that patient go to ED to have further testing and will recommend CXR and COVID testing on arrival to ED.   Discussed this with patient. She is agreeable to go to ED.   Precepted with Dr. Lavetta Nielsen, DO, PGY-2 [facility] 11/15/2018 11:49 AM

## 2019-02-04 ENCOUNTER — Telehealth: Payer: Self-pay

## 2019-02-04 DIAGNOSIS — Z20822 Contact with and (suspected) exposure to covid-19: Secondary | ICD-10-CM

## 2019-02-04 NOTE — Telephone Encounter (Signed)
Pt returned call and scheduled per Dr Rachell Cipro request. Order placed.

## 2019-02-04 NOTE — Telephone Encounter (Signed)
[place] with Dr. Rachell Cipro, called, requests COVID 19 test due to exposure. Left message to call back. Office # [phone]. Fax# [phone].

## 2019-02-05 ENCOUNTER — Other Ambulatory Visit: Payer: Self-pay

## 2019-02-05 DIAGNOSIS — Z20822 Contact with and (suspected) exposure to covid-19: Secondary | ICD-10-CM

## 2019-02-08 LAB — NOVEL CORONAVIRUS, NAA: SARS-CoV-2, NAA: NOT DETECTED

## 2019-05-23 ENCOUNTER — Other Ambulatory Visit: Payer: Self-pay

## 2019-05-23 DIAGNOSIS — Z20822 Contact with and (suspected) exposure to covid-19: Secondary | ICD-10-CM

## 2019-05-24 LAB — NOVEL CORONAVIRUS, NAA: SARS-CoV-2, NAA: NOT DETECTED

## 2020-03-05 ENCOUNTER — Other Ambulatory Visit: Payer: Self-pay

## 2020-03-05 ENCOUNTER — Encounter (HOSPITAL_COMMUNITY): Payer: Self-pay

## 2020-03-05 DIAGNOSIS — O200 Threatened abortion: Secondary | ICD-10-CM | POA: Insufficient documentation

## 2020-03-05 DIAGNOSIS — Z3A08 8 weeks gestation of pregnancy: Secondary | ICD-10-CM | POA: Insufficient documentation

## 2020-03-05 DIAGNOSIS — O209 Hemorrhage in early pregnancy, unspecified: Secondary | ICD-10-CM | POA: Diagnosis present

## 2020-03-05 NOTE — ED Triage Notes (Signed)
Pt c/o spotting and cramping starting yesterday afternoon. Pt states she is [redacted] weeks pregnant. Pt c/o increased bleeding and cramping today, pt bleeding through pants in triage and stated there were clots in her underwear and she believes she has miscarried.

## 2020-03-06 ENCOUNTER — Emergency Department (HOSPITAL_COMMUNITY)
Admission: EM | Admit: 2020-03-06 | Discharge: 2020-03-06 | Disposition: A | Discharge status: Home / Self Care | Payer: Managed Care, Other (non HMO) | Attending: Emergency Medicine | Admitting: Emergency Medicine

## 2020-03-06 ENCOUNTER — Emergency Department (HOSPITAL_COMMUNITY): Payer: Managed Care, Other (non HMO)

## 2020-03-06 DIAGNOSIS — O200 Threatened abortion: Secondary | ICD-10-CM

## 2020-03-06 LAB — COMPREHENSIVE METABOLIC PANEL
ALT: 21 U/L (ref 0–44)
AST: 24 U/L (ref 15–41)
Albumin: 4.5 g/dL (ref 3.5–5.0)
Alkaline Phosphatase: 85 U/L (ref 38–126)
Anion gap: 11 (ref 5–15)
BUN: 13 mg/dL (ref 6–20)
CO2: 26 mmol/L (ref 22–32)
Calcium: 9.3 mg/dL (ref 8.9–10.3)
Chloride: 105 mmol/L (ref 98–111)
Creatinine, Ser: 0.6 mg/dL (ref 0.44–1.00)
GFR calc Af Amer: >60 mL/min (ref >60)
GFR calc non Af Amer: >60 mL/min (ref >60)
Glucose, Bld: 96 mg/dL (ref 70–99)
Potassium: 4.4 mmol/L (ref 3.5–5.1)
Sodium: 142 mmol/L (ref 135–145)
Total Bilirubin: 0.7 mg/dL (ref 0.3–1.2)
Total Protein: 6.9 g/dL (ref 6.5–8.1)

## 2020-03-06 LAB — CBC WITH DIFFERENTIAL/PLATELET
Abs Immature Granulocytes: 0.01 10*3/uL (ref 0.00–0.07)
Basophils Absolute: 0.1 10*3/uL (ref 0.0–0.1)
Basophils Relative: 1 %
Eosinophils Absolute: 0.1 10*3/uL (ref 0.0–0.5)
Eosinophils Relative: 1 %
HCT: 41.6 % (ref 36.0–46.0)
Hemoglobin: 13.6 g/dL (ref 12.0–15.0)
Immature Granulocytes: 0 %
Lymphocytes Relative: 27 %
Lymphs Abs: 2 10*3/uL (ref 0.7–4.0)
MCH: 32.2 pg (ref 26.0–34.0)
MCHC: 32.7 g/dL (ref 30.0–36.0)
MCV: 98.6 fL (ref 80.0–100.0)
Monocytes Absolute: 0.6 10*3/uL (ref 0.1–1.0)
Monocytes Relative: 8 %
Neutro Abs: 4.8 10*3/uL (ref 1.7–7.7)
Neutrophils Relative %: 63 %
Platelets: 207 10*3/uL (ref 150–400)
RBC: 4.22 MIL/uL (ref 3.87–5.11)
RDW: 12.7 % (ref 11.5–15.5)
WBC: 7.6 10*3/uL (ref 4.0–10.5)
nRBC: 0 % (ref 0.0–0.2)

## 2020-03-06 LAB — I-STAT BETA HCG BLOOD, ED (NOT ORDERABLE): I-stat hCG, quantitative: >2000 m[IU]/mL — ABNORMAL HIGH (ref <5)

## 2020-03-06 LAB — HCG, QUANTITATIVE, PREGNANCY: hCG, Beta Chain, Quant, S: 10327 m[IU]/mL — ABNORMAL HIGH (ref <5)

## 2020-03-06 LAB — ABO/RH: ABO/RH(D): B POS

## 2020-03-06 MED ORDER — OXYCODONE-ACETAMINOPHEN 5-325 MG PO TABS
1.0000 | ORAL_TABLET | Freq: Once | ORAL | Status: AC
  Administered 2020-03-06: 1 via ORAL
  Filled 2020-03-06: qty 1

## 2020-03-06 NOTE — Discharge Instructions (Addendum)
Please plan to follow up with your OB per your scheduled visit on Monday to discuss further testing and evaluation of your condition.   If you have worsening pain, persistent heavy bleeding, lightheadedness or new concern, please return to the emergency department for further evaluation.

## 2020-03-06 NOTE — ED Notes (Signed)
Pt light headed and felt like passing out after blood work. VS obtained.

## 2020-03-06 NOTE — ED Provider Notes (Signed)
[facility]-EMERGENCY DEPT Provider Note   CSN: [id number] Arrival date & time: 03/05/20  2157     History Chief Complaint  Patient presents with  . Vaginal Bleeding  . Pregnant    Allison King is a 34 y.o. female.  Patient to ED with vaginal bleeding that started yesterday as light spotting and became heavier, prompting ED evaluation. She is estimated to be [redacted] weeks pregnant, G1P0. While in the waiting room she reports increased lower abdominal cramping and passing clots and what appeared to be tissue. No vaginal discharge prior to the start of bleeding. No urinary symptoms. No nausea or vomiting.   The history is provided by the patient. No language interpreter was used.  Vaginal Bleeding Associated symptoms: abdominal pain   Associated symptoms: no dysuria, no fever, no nausea and no vaginal discharge        Past Medical History:  Diagnosis Date  . Ovarian cyst     Patient Active Problem List   Diagnosis Date Noted  . Current severe episode of major depressive disorder without psychotic features without prior episode (HCC) 09/06/2018  . GAD (generalized anxiety disorder) 09/06/2018  . History of iron deficiency anemia 09/21/2017    History reviewed. No pertinent surgical history.   OB History   No obstetric history on file.     History reviewed. No pertinent family history.  Social History   Tobacco Use  . Smoking status: Never Smoker  . Smokeless tobacco: Never Used  Substance Use Topics  . Alcohol use: Never    Alcohol/week: 0.0 standard drinks  . Drug use: Never    Home Medications Prior to Admission medications   Medication Sig Start Date End Date Taking? Authorizing Provider  hydrOXYzine (ATARAX/VISTARIL) 25 MG tablet Take 1 tablet (25 mg total) by mouth at bedtime as needed. 09/25/18   Shon Hale, MD  Prenatal Vit-Fe Fumarate-FA (PRENATAL MULTIVITAMIN) TABS tablet Take 1 tablet by mouth daily at 12 noon.     [provider]    Allergies    Sulfa antibiotics  Review of Systems   Review of Systems  Constitutional: Negative for fever.  Gastrointestinal: Positive for abdominal pain. Negative for nausea.  Genitourinary: Positive for vaginal bleeding. Negative for dysuria and vaginal discharge.  Neurological: Negative for syncope, weakness and light-headedness.    Physical Exam Updated Vital Signs BP (!) 126/96 (BP Location: Right Arm)   Pulse 62   Temp 98.3 F (36.8 C) (Oral)   Resp (!) 24   SpO2 100%   Physical Exam Vitals and nursing note reviewed.  Constitutional:      Appearance: She is well-developed.  Pulmonary:     Effort: Pulmonary effort is normal.  Genitourinary:    Comments: Scant cervical bleeding with clots. No visualized POC.  Musculoskeletal:     Cervical back: Normal range of motion.  Skin:    General: Skin is warm and dry.  Neurological:     Mental Status: She is alert and oriented to person, place, and time.     ED Results / Procedures / Treatments   Labs (all labs ordered are listed, but only abnormal results are displayed) Labs Reviewed  HCG, QUANTITATIVE, PREGNANCY - Abnormal; Notable for the following components:      Result Value   hCG, Beta Chain, Quant, S 10,327 (*)    All other components within normal limits  I-STAT BETA HCG BLOOD, ED (NOT ORDERABLE) - Abnormal; Notable for the following components:   I-stat  hCG, quantitative >2,000.0 (*)    All other components within normal limits  CBC WITH DIFFERENTIAL/PLATELET  COMPREHENSIVE METABOLIC PANEL  I-STAT BETA HCG BLOOD, ED ([facility], [facility], [facility] ONLY)   Results for orders placed or performed during the hospital encounter of 03/06/20  CBC with Differential  Result Value Ref Range   WBC 7.6 4.0 - 10.5 K/uL   RBC 4.22 3.87 - 5.11 MIL/uL   Hemoglobin 13.6 12.0 - 15.0 g/dL   HCT 16.141.6 36 - 46 %   MCV 98.6 80.0 - 100.0 fL   MCH 32.2 26.0 - 34.0 pg   MCHC 32.7 30.0 - 36.0 g/dL   RDW 09.612.7 04.511.5 -  40.915.5 %   Platelets 207 150 - 400 K/uL   nRBC 0.0 0.0 - 0.2 %   Neutrophils Relative % 63 %   Neutro Abs 4.8 1.7 - 7.7 K/uL   Lymphocytes Relative 27 %   Lymphs Abs 2.0 0.7 - 4.0 K/uL   Monocytes Relative 8 %   Monocytes Absolute 0.6 0 - 1 K/uL   Eosinophils Relative 1 %   Eosinophils Absolute 0.1 0 - 0 K/uL   Basophils Relative 1 %   Basophils Absolute 0.1 0 - 0 K/uL   Immature Granulocytes 0 %   Abs Immature Granulocytes 0.01 0.00 - 0.07 K/uL  Comprehensive metabolic panel  Result Value Ref Range   Sodium 142 135 - 145 mmol/L   Potassium 4.4 3.5 - 5.1 mmol/L   Chloride 105 98 - 111 mmol/L   CO2 26 22 - 32 mmol/L   Glucose, Bld 96 70 - 99 mg/dL   BUN 13 6 - 20 mg/dL   Creatinine, Ser 8.110.60 0.44 - 1.00 mg/dL   Calcium 9.3 8.9 - 91.410.3 mg/dL   Total Protein 6.9 6.5 - 8.1 g/dL   Albumin 4.5 3.5 - 5.0 g/dL   AST 24 15 - 41 U/L   ALT 21 0 - 44 U/L   Alkaline Phosphatase 85 38 - 126 U/L   Total Bilirubin 0.7 0.3 - 1.2 mg/dL   GFR calc non Af Amer >60 >60 mL/min   GFR calc Af Amer >60 >60 mL/min   Anion gap 11 5 - 15  hCG, quantitative, pregnancy  Result Value Ref Range   hCG, Beta Chain, Quant, S 10,327 (H) <5 mIU/mL  I-Stat beta hCG blood, ED  Result Value Ref Range   I-stat hCG, quantitative >2,000.0 (H) <5 mIU/mL   Comment 3            EKG None  Radiology No results found. US OB Comp < 14 Wks  Result Date: 03/06/2020 CLINICAL DATA:  Initial evaluation for acute vaginal bleeding, early pregnancy. EXAM: OBSTETRIC <14 WK US AND TRANSVAGINAL OB US TECHNIQUE: Both transabdominal and transvaginal ultrasound examinations were performed for complete evaluation of the gestation as well as the maternal uterus, adnexal regions, and pelvic cul-de-sac. Transvaginal technique was performed to assess early pregnancy. COMPARISON:  None available. FINDINGS: Intrauterine gestational sac: No definite IUP. 4 mm cystic structure seen along the periphery of the endometrial complex at the level  of the lower uterine segment noted, could reflect a small early gestational sac versus is cyst. Yolk sac:  Negative. Embryo:  Negative. Cardiac Activity: Negative. Subchorionic hemorrhage:  None visualized. Maternal uterus/adnexae: Left ovary within normal limits. 2.5 x 1.2 x 1.7 cm hypoechoic cystic lesion within the right ovary noted, likely a degenerating corpus luteal cyst. No other adnexal mass. Possible trace free fluid noted  within the pelvis. IMPRESSION: 1. No definite IUP identified. 4 mm cystic structure at the level of the lower uterine segment could reflect an early gestational sac versus a small cyst. No adnexal mass. Findings are consistent with a pregnancy of unknown anatomic location at this time. Differential considerations include IUP to early to visualize, recent SAB, or possibly occult ectopic pregnancy. Close clinical monitoring with serial beta HCGs and close interval follow-up ultrasound recommended as clinically warranted. 2. 2.5 cm hypoechoic right ovarian cystic lesion, likely a degenerating corpus luteal cyst. 3. No other acute maternal uterine or adnexal abnormality identified. Electronically Signed   By: Rise Mu M.D.   On: 03/06/2020 01:59   US OB Transvaginal  Result Date: 03/06/2020 CLINICAL DATA:  Initial evaluation for acute vaginal bleeding, early pregnancy. EXAM: OBSTETRIC <14 WK [place] AND TRANSVAGINAL OB US TECHNIQUE: Both transabdominal and transvaginal ultrasound examinations were performed for complete evaluation of the gestation as well as the maternal uterus, adnexal regions, and pelvic cul-de-sac. Transvaginal technique was performed to assess early pregnancy. COMPARISON:  None available. FINDINGS: Intrauterine gestational sac: No definite IUP. 4 mm cystic structure seen along the periphery of the endometrial complex at the level of the lower uterine segment noted, could reflect a small early gestational sac versus is cyst. Yolk sac:  Negative. Embryo:   Negative. Cardiac Activity: Negative. Subchorionic hemorrhage:  None visualized. Maternal uterus/adnexae: Left ovary within normal limits. 2.5 x 1.2 x 1.7 cm hypoechoic cystic lesion within the right ovary noted, likely a degenerating corpus luteal cyst. No other adnexal mass. Possible trace free fluid noted within the pelvis. IMPRESSION: 1. No definite IUP identified. 4 mm cystic structure at the level of the lower uterine segment could reflect an early gestational sac versus a small cyst. No adnexal mass. Findings are consistent with a pregnancy of unknown anatomic location at this time. Differential considerations include IUP to early to visualize, recent SAB, or possibly occult ectopic pregnancy. Close clinical monitoring with serial beta HCGs and close interval follow-up ultrasound recommended as clinically warranted. 2. 2.5 cm hypoechoic right ovarian cystic lesion, likely a degenerating corpus luteal cyst. 3. No other acute maternal uterine or adnexal abnormality identified. Electronically Signed   By: Rise Mu M.D.   On: 03/06/2020 01:59    Procedures Procedures (including critical care time)  Medications Ordered in ED Medications  oxyCODONE-acetaminophen (PERCOCET/ROXICET) 5-325 MG per tablet 1 tablet (1 tablet Oral Given 03/06/20 0049)    ED Course  I have reviewed the triage vital signs and the nursing notes.  Pertinent labs & imaging results that were available during my care of the patient were reviewed by me and considered in my medical decision making (see chart for details).    MDM Rules/Calculators/A&P                          Patient to ED with vaginal bleeding in early pregnancy with abdominal cramping.   Patient is overall well appearing, in NAD. Blood pressure has been as low as 81/44, however, the patient knows her blood pressure to be 80 to 100 systolic normally. No lightheadedness, syncope. Hgb 13.6.  [place] does not verify any definitive gestational sac, early  pregnancy vs SAB vs ectopic. Pelvic exam does not show any suspected POC however, patient kept passed material that appears mucoid, possible products. Quantitative Hcg 10,300. This will need to be repeated in follow up with OB/GYN. She is Rh positive and will not need  Rhogam.   Patient is felt appropriate for discharge home. All questions answered. She has an already scheduled appointment with OB 2 days and is encouraged to keep this appointment for recheck.       Final Clinical Impression(s) / ED Diagnoses Final diagnoses:  None   1. Threatened abortion  Rx / DC Orders ED Discharge Orders    None       Elpidio Anis, Cordelia Poche 03/07/20 0747    Ward, Layla Maw, DO 03/07/20 2306

## 2020-10-08 ENCOUNTER — Other Ambulatory Visit: Payer: Self-pay | Admitting: Obstetrics and Gynecology

## 2020-10-08 DIAGNOSIS — Z363 Encounter for antenatal screening for malformations: Secondary | ICD-10-CM

## 2020-10-08 DIAGNOSIS — Q330 Congenital cystic lung: Secondary | ICD-10-CM

## 2020-10-11 ENCOUNTER — Encounter: Payer: Self-pay | Admitting: *Deleted

## 2020-10-12 ENCOUNTER — Other Ambulatory Visit: Payer: Self-pay | Admitting: *Deleted

## 2020-10-12 ENCOUNTER — Ambulatory Visit: Payer: Managed Care, Other (non HMO) | Attending: Obstetrics and Gynecology

## 2020-10-12 ENCOUNTER — Ambulatory Visit (HOSPITAL_BASED_OUTPATIENT_CLINIC_OR_DEPARTMENT_OTHER): Payer: Managed Care, Other (non HMO) | Admitting: *Deleted

## 2020-10-12 ENCOUNTER — Encounter: Payer: Self-pay | Admitting: *Deleted

## 2020-10-12 ENCOUNTER — Other Ambulatory Visit: Payer: Self-pay

## 2020-10-12 VITALS — BP 109/61 | HR 82

## 2020-10-12 DIAGNOSIS — Q330 Congenital cystic lung: Secondary | ICD-10-CM

## 2020-10-12 DIAGNOSIS — Z363 Encounter for antenatal screening for malformations: Secondary | ICD-10-CM | POA: Diagnosis present

## 2020-10-12 DIAGNOSIS — Z3A19 19 weeks gestation of pregnancy: Secondary | ICD-10-CM | POA: Diagnosis not present

## 2020-10-12 DIAGNOSIS — O359XX0 Maternal care for (suspected) fetal abnormality and damage, unspecified, not applicable or unspecified: Secondary | ICD-10-CM

## 2020-10-12 DIAGNOSIS — O283 Abnormal ultrasonic finding on antenatal screening of mother: Secondary | ICD-10-CM

## 2020-10-13 NOTE — Progress Notes (Signed)
MFM Note  Allison King was seen for a detailed fetal anatomy scan due to multiple cystic lesions that were noted in the fetal chest on a fetal anatomy scan performed in your office.  She denies any significant past medical history and denies any problems in her current pregnancy.    She had a cell free DNA test drawn which indicated a a low risk for trisomy 21, 18, and 13.  A female fetus is predicted.  The fetal growth and amniotic fluid level appeared appropriate for her gestational age.  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  The echogenic chest mass occupies almost the entire left side of the fetal chest and is shifting the fetal heart to the right.  The chest mass contains multiple large and small cysts.  There were no signs of fetal hydrops noted today.  The patient was advised that CPAM is one of the most common lung lesions diagnosed prenatally.  For most babies born with a CPAM, the prognosis is good unless hydrops is noted prenatally.  Sometimes, the lesions decrease in size or can no longer be seen later in pregnancy. Usually, the lungs develop normally despite the presence of these lesions.    She was advised that babies born with a CPAM may be at increased risk for breathing issues due to underdevelopment of lung tissue or may be at increased risk for recurrent pulmonary infections after birth.  There may also be an association with malignancy after birth.  Due to the risk of these complications, most pediatric surgeons would advocate for the removal of the CPAM after birth.  The patient was advised that we will continue to follow her closely throughout her pregnancy with serial ultrasounds to assess this finding.  Although there were no obvious cardiac defects noted today, due to the CPAM, the patient was referred for a fetal echocardiogram with [facility] pediatric cardiology.  We will also schedule a prenatal  consultation later in her pregnancy with pediatric surgery to discuss the management of CPAM after birth.    The patient was advised that although a CPAM is generally not associated with fetal aneuploidy, any a fetus with an abnormality may be at increased risk for fetal aneuploidy.  Due to this increased risk, she was offered and declined an amniocentesis for definitive diagnosis of fetal aneuploidy today.  She is comfortable with her negative cell free DNA test.  A follow-up exam was scheduled in 3 weeks.  Total time spent in consultation: 30 minutes.

## 2020-11-02 ENCOUNTER — Ambulatory Visit: Payer: Managed Care, Other (non HMO) | Attending: Obstetrics

## 2020-11-02 ENCOUNTER — Other Ambulatory Visit: Payer: Self-pay | Admitting: *Deleted

## 2020-11-02 ENCOUNTER — Other Ambulatory Visit: Payer: Self-pay

## 2020-11-02 ENCOUNTER — Ambulatory Visit: Payer: Managed Care, Other (non HMO) | Admitting: *Deleted

## 2020-11-02 ENCOUNTER — Encounter: Payer: Self-pay | Admitting: *Deleted

## 2020-11-02 VITALS — BP 121/60 | HR 66

## 2020-11-02 DIAGNOSIS — O350XX0 Maternal care for (suspected) central nervous system malformation in fetus, not applicable or unspecified: Secondary | ICD-10-CM | POA: Diagnosis present

## 2020-11-02 DIAGNOSIS — Q330 Congenital cystic lung: Secondary | ICD-10-CM | POA: Diagnosis not present

## 2020-11-02 DIAGNOSIS — Z3A22 22 weeks gestation of pregnancy: Secondary | ICD-10-CM | POA: Diagnosis not present

## 2020-11-02 DIAGNOSIS — O358XX0 Maternal care for other (suspected) fetal abnormality and damage, not applicable or unspecified: Secondary | ICD-10-CM | POA: Diagnosis not present

## 2020-11-02 DIAGNOSIS — O3503X0 Maternal care for (suspected) central nervous system malformation or damage in fetus, choroid plexus cysts, not applicable or unspecified: Secondary | ICD-10-CM

## 2020-11-23 ENCOUNTER — Other Ambulatory Visit: Payer: Self-pay

## 2020-11-23 ENCOUNTER — Other Ambulatory Visit: Payer: Self-pay | Admitting: *Deleted

## 2020-11-23 ENCOUNTER — Ambulatory Visit: Payer: Managed Care, Other (non HMO) | Admitting: *Deleted

## 2020-11-23 ENCOUNTER — Encounter: Payer: Self-pay | Admitting: *Deleted

## 2020-11-23 ENCOUNTER — Ambulatory Visit: Payer: Managed Care, Other (non HMO) | Attending: Obstetrics and Gynecology

## 2020-11-23 VITALS — BP 111/54 | HR 60

## 2020-11-23 DIAGNOSIS — Z3A25 25 weeks gestation of pregnancy: Secondary | ICD-10-CM | POA: Diagnosis not present

## 2020-11-23 DIAGNOSIS — Q330 Congenital cystic lung: Secondary | ICD-10-CM

## 2020-11-23 DIAGNOSIS — O359XX0 Maternal care for (suspected) fetal abnormality and damage, unspecified, not applicable or unspecified: Secondary | ICD-10-CM

## 2020-11-26 ENCOUNTER — Other Ambulatory Visit: Payer: Self-pay | Admitting: *Deleted

## 2020-11-26 DIAGNOSIS — Q330 Congenital cystic lung: Secondary | ICD-10-CM

## 2020-12-16 ENCOUNTER — Other Ambulatory Visit: Payer: Self-pay

## 2020-12-16 ENCOUNTER — Ambulatory Visit: Payer: Managed Care, Other (non HMO) | Attending: Maternal & Fetal Medicine

## 2020-12-16 ENCOUNTER — Ambulatory Visit: Payer: Managed Care, Other (non HMO) | Admitting: *Deleted

## 2020-12-16 ENCOUNTER — Encounter: Payer: Self-pay | Admitting: *Deleted

## 2020-12-16 VITALS — BP 118/57 | HR 69

## 2020-12-16 DIAGNOSIS — O359XX0 Maternal care for (suspected) fetal abnormality and damage, unspecified, not applicable or unspecified: Secondary | ICD-10-CM | POA: Diagnosis not present

## 2020-12-16 DIAGNOSIS — Q330 Congenital cystic lung: Secondary | ICD-10-CM | POA: Insufficient documentation

## 2020-12-16 DIAGNOSIS — Z3A28 28 weeks gestation of pregnancy: Secondary | ICD-10-CM | POA: Diagnosis not present

## 2020-12-21 ENCOUNTER — Ambulatory Visit: Payer: Managed Care, Other (non HMO) | Attending: Maternal & Fetal Medicine

## 2020-12-21 ENCOUNTER — Encounter: Payer: Self-pay | Admitting: *Deleted

## 2020-12-21 ENCOUNTER — Ambulatory Visit: Payer: Managed Care, Other (non HMO) | Admitting: *Deleted

## 2020-12-21 ENCOUNTER — Other Ambulatory Visit: Payer: Self-pay

## 2020-12-21 ENCOUNTER — Other Ambulatory Visit: Payer: Self-pay | Admitting: *Deleted

## 2020-12-21 VITALS — BP 102/56 | HR 66

## 2020-12-21 DIAGNOSIS — O359XX0 Maternal care for (suspected) fetal abnormality and damage, unspecified, not applicable or unspecified: Secondary | ICD-10-CM

## 2020-12-21 DIAGNOSIS — Z3A29 29 weeks gestation of pregnancy: Secondary | ICD-10-CM | POA: Diagnosis not present

## 2020-12-21 DIAGNOSIS — Q330 Congenital cystic lung: Secondary | ICD-10-CM

## 2020-12-28 ENCOUNTER — Ambulatory Visit: Payer: Managed Care, Other (non HMO)

## 2021-01-12 ENCOUNTER — Ambulatory Visit: Payer: Managed Care, Other (non HMO) | Admitting: *Deleted

## 2021-01-12 ENCOUNTER — Ambulatory Visit: Payer: Managed Care, Other (non HMO) | Attending: Obstetrics

## 2021-01-12 ENCOUNTER — Other Ambulatory Visit: Payer: Self-pay

## 2021-01-12 ENCOUNTER — Other Ambulatory Visit: Payer: Self-pay | Admitting: Obstetrics

## 2021-01-12 ENCOUNTER — Encounter: Payer: Self-pay | Admitting: *Deleted

## 2021-01-12 VITALS — BP 109/49 | HR 57

## 2021-01-12 DIAGNOSIS — Z3A32 32 weeks gestation of pregnancy: Secondary | ICD-10-CM

## 2021-01-12 DIAGNOSIS — Q330 Congenital cystic lung: Secondary | ICD-10-CM | POA: Diagnosis present

## 2021-01-12 DIAGNOSIS — O359XX0 Maternal care for (suspected) fetal abnormality and damage, unspecified, not applicable or unspecified: Secondary | ICD-10-CM

## 2021-01-18 ENCOUNTER — Telehealth: Payer: Self-pay | Admitting: Obstetrics and Gynecology

## 2021-01-18 NOTE — Telephone Encounter (Signed)
I spoke with Ms. Bryant-Pardini regarding upcoming visits and I spoke with Brayton Layman, the care coordinator at [facility].  The patient met with peds surgery at [facility] and plans to deliver there.  She has a visit with MFM at [facility] scheduled for 6/24 for an ultrasound and delivery planning.  We will therefore cancel her follow up ultrasound with [facility] on 6/23.  She is also to continue visits with her local OB including weekly testing as recommended by Dr. Annamaria Boots.  Wilburt Finlay, MS, CGC

## 2021-02-10 ENCOUNTER — Ambulatory Visit: Payer: Managed Care, Other (non HMO)

## 2021-02-27 ENCOUNTER — Telehealth (HOSPITAL_COMMUNITY): Payer: Self-pay

## 2021-02-27 NOTE — Consult Note (Signed)
   [name] [phone])  Mom called to talk about improving latch, pumping and bottle feeding and signs, symptoms and prevention of engorgement.  Infant 35 days old and Mom stated some pain with the latch and infant only latching on one side.  Mom stated latch improved following a breast rest where she was pumping and bottle feeding.  Mom pumping q 3 hrs after breastfeeding getting 60 ml of EBM per pumping session. Mom supplementing with formula but with such good volumes LC assured her she had enough to supplement with her own breast milk as long as not using high calorie formula for weight gain.  Mom seen twice by Pediatrician working on increasing infant's weight. As a result, LC encouraged Mom to continue breastfeeding and supplementing after each feeding until infant reaches birthweight at 10-12th day.   Mom using 24 flange and denies any pain with pumping with current flange size.   Infant does have a labial attachment and we worked on flanging out lips top and bottom keeping cheeks and nose touching the breasts. Also try laid back position and breast compression to engage infant popping on and off.   Infant according to mother adequate urine and stool output. We talked about supplementing using Dr. Theora Gianotti breastfeeding nipple and milk storage of any EBM collected.   Mom to call outpatient [facility] clinic [phone]) for follow up appt in addition to following up with Pediatrician.   Mom to work on latching infant to empty breast, using cold compresses in between feedings to decrease swelling and pumping to prevent engorgement.   All questions answered at the end of the visit.

## 2021-02-28 ENCOUNTER — Telehealth: Payer: Self-pay | Admitting: Lactation Services

## 2021-02-28 NOTE — Telephone Encounter (Signed)
Called patient to offer OP Lactation appt. Mom did not answer. LM for mom to call the office at [phone] to schedule an OP appt or to speak with Lactation. LM that we would need baby's information to schedule appointment.

## 2021-02-28 NOTE — Telephone Encounter (Signed)
-----   Message from [employer] sent at 02/27/2021  6:25 PM EDT ----- Regarding: lactatation    Show:Clear all WrittenTemplatedCopied  Added by: Deborra Medina   Hover for details Mom delivered at different hospital. Infant 69 days old and she is followed by Ped at [facility] Pard[facility] [phone])  Mom called to talk about improving latch, pumping and bottle feeding and signs, symptoms and prevention of engorgement. Infant 10 days old and Mom stated some pain with the latch and infant only latching on one side. Mom stated latch improved following a breast rest where she was pumping and bottle feeding. Mom pumping q 3 hrs after breastfeeding getting 60 ml of EBM per pumping session. Mom supplementing with formula but with such good volumes LC assured her she had enough to supplement with her own breast milk as long as not using high calorie formula for weight gain.  Mom seen twice by Pediatrician working on increasing infant's weight. As a result, LC encouraged Mom to continue breastfeeding and supplementing after each feeding until infant reaches birthweight at 10-12th day.  Mom using 24 flange and denies any pain with pumping with current flange size.  Infant does have a labial attachment and we worked on flanging out lips top and bottom keeping cheeks and nose touching the breasts. Also try laid back position and breast compression to engage infant popping on and off.  Infant according to mother adequate urine and stool output. We talked about supplementing using Dr. Theora Gianotti breastfeeding nipple and milk storage of any EBM collected.  Mom to call outpatient [facility] clinic [phone]) for follow up appt in addition to following up with Pediatrician.  Mom to work on latching infant to empty breast, using cold compresses in between feedings to decrease swelling and pumping to prevent engorgement.  All questions answered at  the end of the visit.

## 2021-03-01 ENCOUNTER — Telehealth: Payer: Self-pay | Admitting: Lactation Services

## 2021-03-01 NOTE — Telephone Encounter (Signed)
Received message from front office that mom has returned call.   Returned mom's call and she reports that her Pediatrician sent a referral for OP Lactation appt.   Referral is in the office and mom offered available appointment this week. She agreed to Thursday 7/14 at 2:15.   Mom informed of location, date and time of appointment.   Mom informed to bring infant hungry, EBM if available and pump. Mom informed she can bring a support person if she would like as long as they can answer no to the Covid questions.   Mom voiced understanding.

## 2021-07-10 IMAGING — US US MFM OB FOLLOW-UP
1 series · 14 of 28 positions shown · non-contrast
Comparison: none

[Series 1: us mfm ob follow-up · 41 acquisitions, 14 frames shown]
[im 2/41]
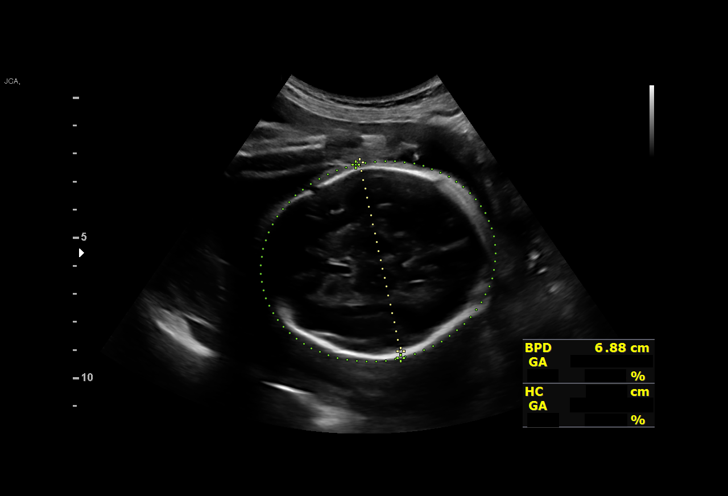
[im 5/41]
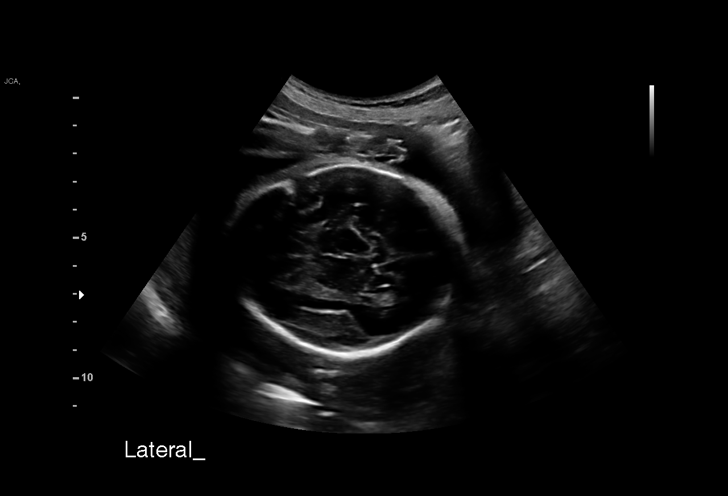
[im 8/41]
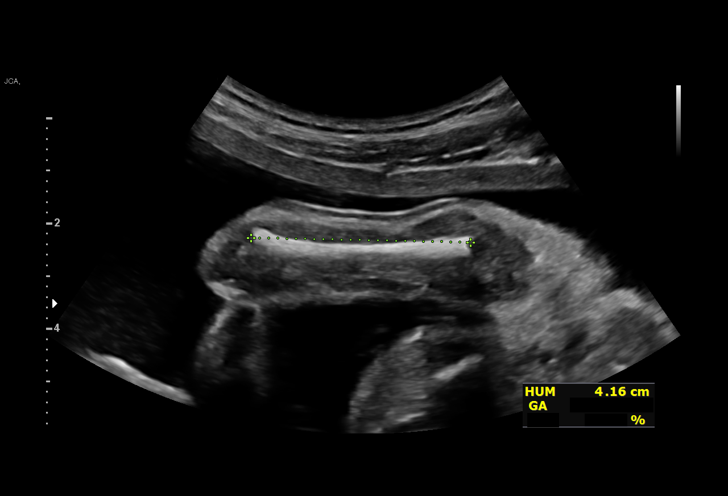
[im 11/41]
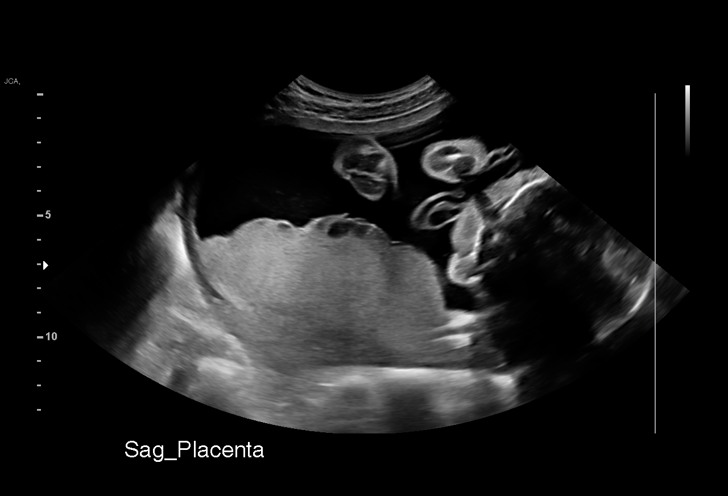
[im 14/41]
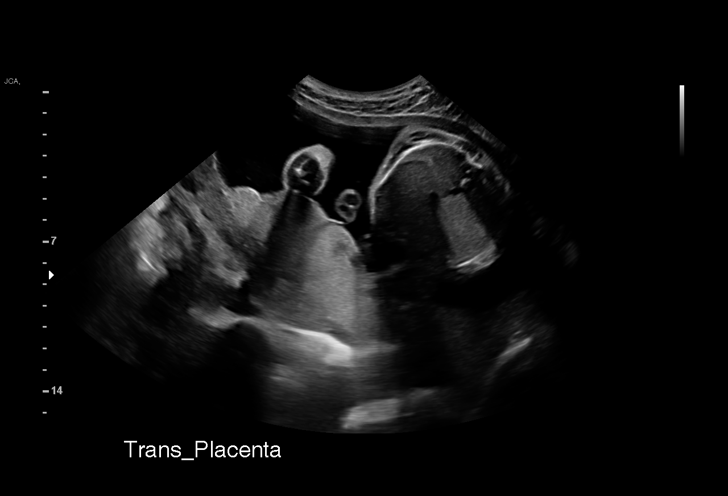
[im 17/41]
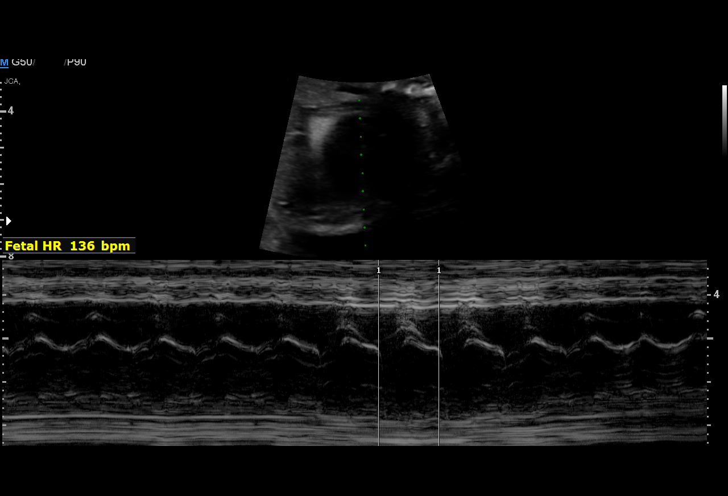
[im 20/41]
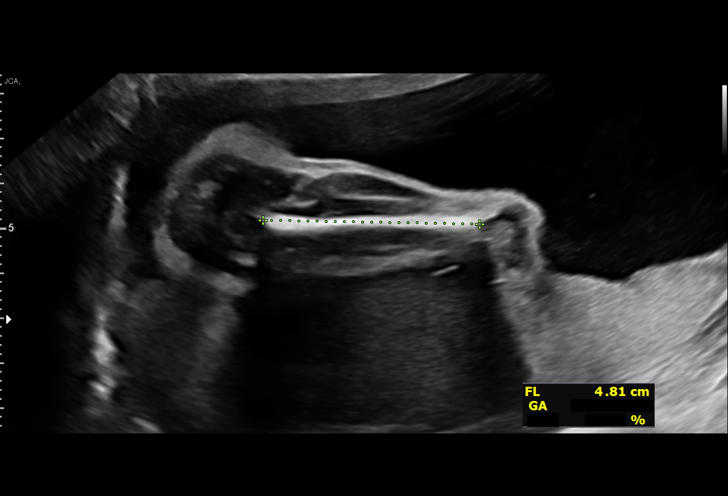
[im 23/41]
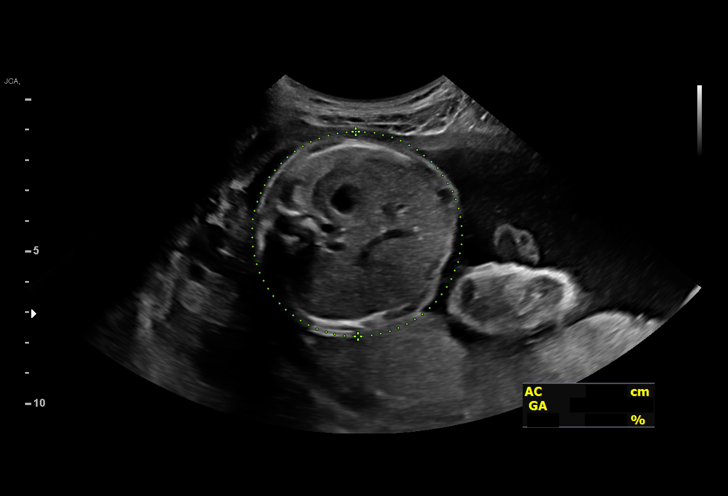
[im 26/41]
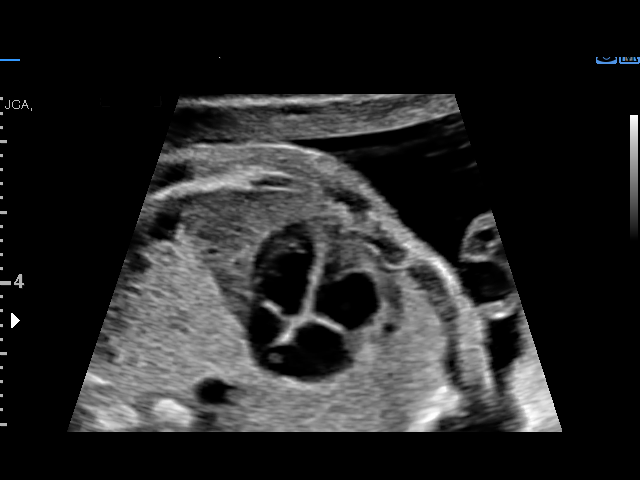
[im 29/41]
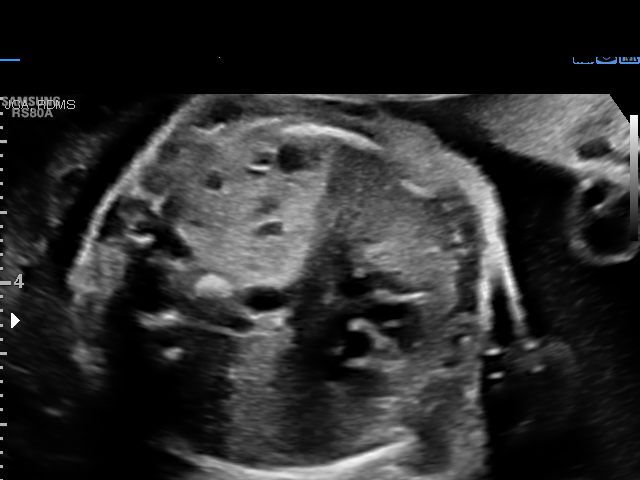
[im 32/41]
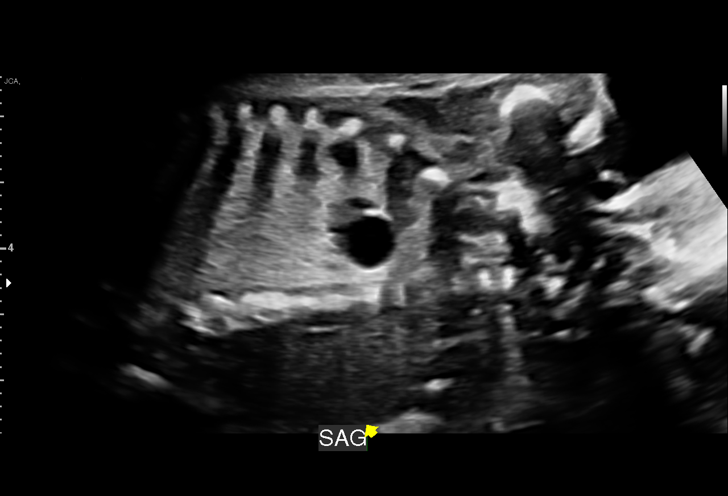
[im 35/41]
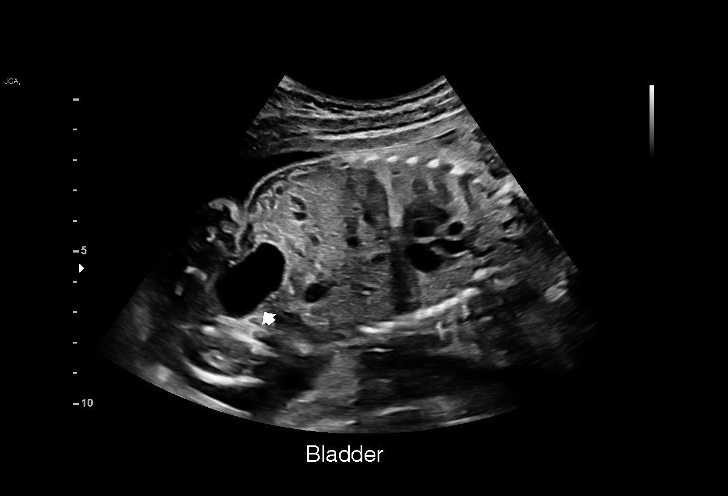
[im 38/41]
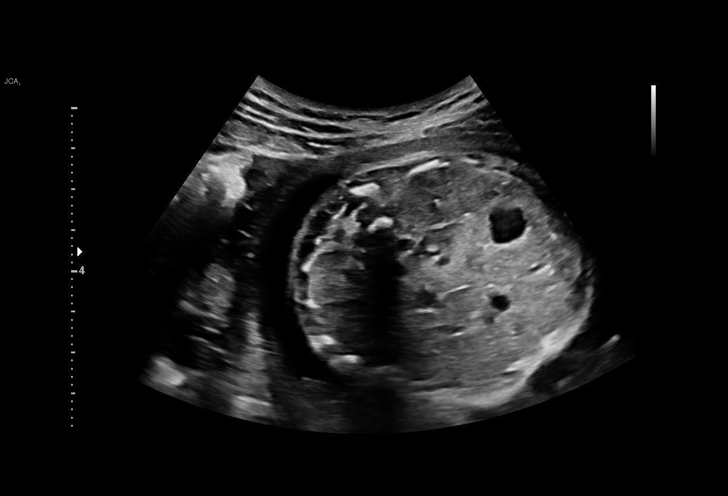
[im 41/41]
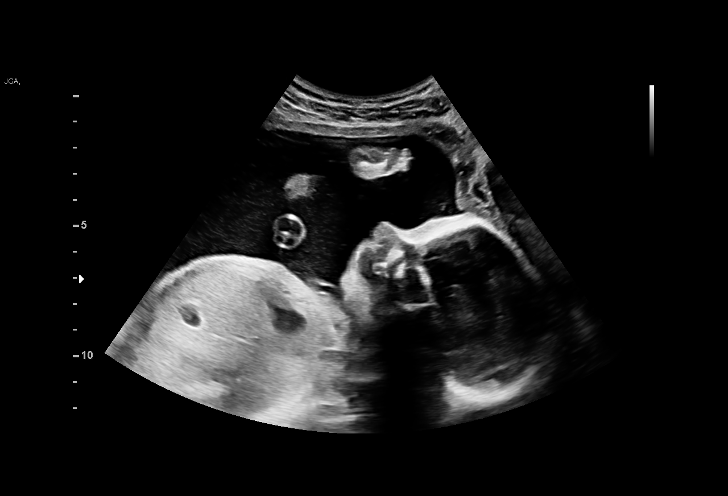

[14 of 28 positions shown; findings below may reference images not displayed]

DUHALDE

Indications

 Fetal abnormality - CPAM
 Encounter for other antenatal screening
 follow-up
 25 weeks gestation of pregnancy
 Normal NIPS
Fetal Evaluation

 Num Of Fetuses:         1
 Fetal Heart Rate(bpm):  136
 Cardiac Activity:       Observed
 Presentation:           Cephalic
 Placenta:               Posterior
 P. Cord Insertion:      Visualized

 Amniotic Fluid
 AFI FV:      Within normal limits

                             Largest Pocket(cm)

Biometry

 BPD:      68.7  mm     G. Age:  27w 4d         96  %    CI:        78.08   %    70 - 86
                                                         FL/HC:      19.3   %    18.7 -
 HC:       246   mm     G. Age:  26w 5d         73  %    HC/AC:      1.13        1.04 -
 AC:      218.4  mm     G. Age:  26w 2d         69  %    FL/BPD:     69.1   %    71 - 87
 FL:       47.5  mm     G. Age:  25w 6d         50  %    FL/AC:      21.7   %    20 - 24
 HUM:      41.6  mm     G. Age:  25w 1d         36  %

 LV:        4.9  mm
 Est. FW:     913  gm           2 lb     76  %
OB History

 Gravidity:    1
Gestational Age

 LMP:           25w 3d        Date:  05/29/20                 EDD:   03/05/21
 U/S Today:     26w 4d                                        EDD:   02/25/21
 Best:          25w 3d     Det. By:  LMP  (05/29/20)          EDD:   03/05/21
Anatomy

 Cranium:               Appears normal         LVOT:                   Previously seen
 Cavum:                 Appears normal         Aortic Arch:            Previously seen
 Ventricles:            Appears normal         Ductal Arch:            Previously seen
 Choroid Plexus:        Previously seen        Diaphragm:              Appears normal
 Cerebellum:            Previously seen        Stomach:                Appears normal, left
                                                                       sided
 Posterior Fossa:       Previously seen        Abdomen:                Previously seen
 Nuchal Fold:           Previously seen        Abdominal Wall:         Previously seen
 Face:                  Orbits and profile     Cord Vessels:           Previously seen
                        previously seen
 Lips:                  Previously seen        Kidneys:                Appear normal
 Palate:                Previously seen        Bladder:                Appears normal
 Thoracic:              Abnormal, see          Spine:                  Previously seen
                        comments
 Heart:                 Previously seen        Upper Extremities:      Previously seen
 RVOT:                  Previously seen        Lower Extremities:      Previously seen
Impression

 Follow up growth due to CPAM ( measured at as 2.5 x 2 x 2
 cm)  with CVR 0.2 today (decrease risk for hydrops)
 Normal interval growth with measurements consistent with
 dates
 Good fetal movement and amniotic fluid volume

 There was no hydrops noted today. The heart is mildly shifted
 to the right as previously seen.

 She will return in 3 weeks for limited exam and growth in 4
 weeks.
Recommendations

 Follow up as noted above.

## 2023-01-04 LAB — RESULTS CONSOLE HPV: CHL HPV: NEGATIVE

## 2023-01-04 LAB — HM PAP SMEAR: HM Pap smear: NORMAL

## 2023-02-01 ENCOUNTER — Ambulatory Visit (INDEPENDENT_AMBULATORY_CARE_PROVIDER_SITE_OTHER): Payer: BC Managed Care – PPO | Admitting: Nurse Practitioner

## 2023-02-01 ENCOUNTER — Encounter: Payer: Self-pay | Admitting: Nurse Practitioner

## 2023-02-01 VITALS — BP 100/64 | HR 72 | Temp 97.1°F | Ht 65.0 in | Wt 124.0 lb

## 2023-02-01 DIAGNOSIS — M79672 Pain in left foot: Secondary | ICD-10-CM | POA: Diagnosis not present

## 2023-02-01 DIAGNOSIS — F339 Major depressive disorder, recurrent, unspecified: Secondary | ICD-10-CM | POA: Insufficient documentation

## 2023-02-01 DIAGNOSIS — Z0000 Encounter for general adult medical examination without abnormal findings: Secondary | ICD-10-CM | POA: Insufficient documentation

## 2023-02-01 NOTE — Assessment & Plan Note (Signed)
Health maintenance reviewed and updated. Discussed nutrition, exercise. Recent labs reviewed. Follow-up 1 year 

## 2023-02-01 NOTE — Progress Notes (Signed)
New Patient Visit  BP 100/64 (BP Location: Right Arm)   Pulse 72   Temp (!) 97.1 F (36.2 C)   Ht 5\' 5"  (1.651 m)   Wt 124 lb (56.2 kg)   LMP 01/01/2023 (Exact Date)   SpO2 99%   Breastfeeding No   BMI 20.63 kg/m    Subjective:    Patient ID: Allison King, female    DOB: [birth year], 37 y.o.   MRN: [id number]  CC: Chief Complaint  Patient presents with   Establish Care    NP. Est. Care, overall check up, trying to conceive    HPI: Allison King is a 37 y.o. female presents for new patient visit to establish care.  Introduced to Publishing rights manager role and practice setting.  All questions answered.  Discussed provider/patient relationship and expectations.  She states that she is doing well and has not been to a primary care provider in several years and is looking for an overall wellness exam.  She is currently trying to conceive with her husband.  She states that her menstrual period is 3 days late, however she went to her OB/GYN yesterday and had a negative urine and blood pregnancy test.  She states that her left foot has been hurting off and on for the last 6 months.  She is a long-distance runner and is concerned about a stress fracture.  She has not been running recently however has been walking a lot.  She has been trying to ice her foot and elevated when sitting.  She states that it is not hurting today.  She has a history of depression and is doing well with this.  She is currently taking sertraline 50 mg daily.     02/01/2023    2:04 PM 09/25/2018    9:48 AM 09/25/2018    9:42 AM 09/06/2018    4:44 PM 01/09/2018    2:32 PM  Depression screen PHQ 2/9  Decreased Interest 1 1 1 3 1   Down, Depressed, Hopeless 1 2 1 3 2   PHQ - 2 Score 2 3 2 6 3   Altered sleeping 0 3  3 2   Tired, decreased energy 2 3  3 2   Change in appetite 0 0  2 0  Feeling bad or failure about yourself  0 1  3 0  Trouble concentrating 0 2  3 2   Moving slowly or  fidgety/restless  0  2 0  Suicidal thoughts 0 0  1 0  PHQ-9 Score 4 12  23 9   Difficult doing work/chores Not difficult at all Very difficult  Very difficult Somewhat difficult      02/01/2023    2:04 PM 09/25/2018    9:50 AM 09/06/2018    4:44 PM 01/09/2018    2:33 PM  GAD 7 : Generalized Anxiety Score  Nervous, Anxious, on Edge 0 2 3 3   Control/stop worrying 0 2 3 3   Worry too much - different things 0 2 3 3   Trouble relaxing 0 2 3 3   Restless 0 1 3 2   Easily annoyed or irritable 0 2 3 1   Afraid - awful might happen 0 0 0 0  Total GAD 7 Score 0 11 18 15   Anxiety Difficulty Not difficult at all Very difficult Very difficult Extremely difficult    Past Medical History:  Diagnosis Date   Depression 2022   Ongoing since teens   Ovarian cyst     History reviewed. No pertinent surgical history.  Family  History  Problem Relation Age of Onset   Cancer Mother        breast   Miscarriages / [place] Mother    Depression Maternal Grandmother    Arthritis Paternal Grandmother    Alcohol abuse Paternal Grandfather      Social History   Tobacco Use   Smoking status: Never   Smokeless tobacco: Never  Vaping Use   Vaping Use: Never used  Substance Use Topics   Alcohol use: Not Currently    Comment: I stopped drinking alcohol at age 60.   Drug use: Never    Current Outpatient Medications on File Prior to Visit  Medication Sig Dispense Refill   Prenatal Vit-Fe Fumarate-FA (PRENATAL MULTIVITAMIN) TABS tablet Take 1 tablet by mouth daily at 12 noon.     sertraline (ZOLOFT) 50 MG tablet Take 50 mg by mouth daily.     No current facility-administered medications on file prior to visit.     Review of Systems  Constitutional:  Positive for fatigue. Negative for fever.  HENT: Negative.    Eyes: Negative.   Respiratory: Negative.    Cardiovascular: Negative.   Gastrointestinal: Negative.   Genitourinary:  Positive for menstrual problem (menses 4 days late).   Musculoskeletal:  Positive for arthralgias (left lateral foot).  Skin: Negative.   Neurological: Negative.   Psychiatric/Behavioral: Negative.        Objective:    BP 100/64 (BP Location: Right Arm)   Pulse 72   Temp (!) 97.1 F (36.2 C)   Ht 5\' 5"  (1.651 m)   Wt 124 lb (56.2 kg)   LMP 01/01/2023 (Exact Date)   SpO2 99%   Breastfeeding No   BMI 20.63 kg/m   Wt Readings from Last 3 Encounters:  02/01/23 124 lb (56.2 kg)  09/25/18 125 lb 3.2 oz (56.8 kg)  09/06/18 123 lb (55.8 kg)    BP Readings from Last 3 Encounters:  02/01/23 100/64  01/12/21 (!) 109/49  12/21/20 (!) 102/56    Physical Exam Vitals and nursing note reviewed.  Constitutional:      General: She is not in acute distress.    Appearance: Normal appearance.  HENT:     Head: Normocephalic and atraumatic.     Right Ear: Tympanic membrane, ear canal and external ear normal.     Left Ear: Tympanic membrane, ear canal and external ear normal.  Eyes:     Conjunctiva/sclera: Conjunctivae normal.  Cardiovascular:     Rate and Rhythm: Normal rate and regular rhythm.     Pulses: Normal pulses.     Heart sounds: Normal heart sounds.  Pulmonary:     Effort: Pulmonary effort is normal.     Breath sounds: Normal breath sounds.  Abdominal:     Palpations: Abdomen is soft.     Tenderness: There is no abdominal tenderness.  Musculoskeletal:        General: No swelling or tenderness. Normal range of motion.     Cervical back: Normal range of motion and neck supple.     Right lower leg: No edema.     Left lower leg: No edema.  Lymphadenopathy:     Cervical: No cervical adenopathy.  Skin:    General: Skin is warm and dry.  Neurological:     General: No focal deficit present.     Mental Status: She is alert and oriented to person, place, and time.     Cranial Nerves: No cranial nerve deficit.  Coordination: Coordination normal.     Gait: Gait normal.  Psychiatric:        Mood and Affect: Mood normal.         Behavior: Behavior normal.        Thought Content: Thought content normal.        Judgment: Judgment normal.        Assessment & Plan:   Problem List Items Addressed This Visit       Other   Routine general medical examination at a health care facility - Primary    Health maintenance reviewed and updated. Discussed nutrition, exercise. Recent labs reviewed. Follow-up 1 year.        Depression, recurrent (HCC)    Chronic, stable.  Continue sertraline 50 mg daily.  Follow-up with any concerns.      Other Visit Diagnoses     Left foot pain       Currently not having pain. Started hurting 6 months ago after race. Wear supportive shoes, continue to ice prn. Will get x-ray if starts hurting again.       LABORATORY TESTING:  - Pap smear: up to date  IMMUNIZATIONS:   - Tdap: Tetanus vaccination status reviewed: last tetanus booster within 10 years. - Influenza: Postponed to flu season - Pneumovax: Not applicable - Prevnar: Not applicable - HPV: Not applicable - Zostavax vaccine: Not applicable  SCREENING: -Mammogram: Not applicable  - Colonoscopy: Not applicable  - Bone Density: Not applicable  -Hearing Test: Not applicable  -Spirometry: Not applicable   PATIENT COUNSELING:   Advised to take 1 mg of folate supplement per day if capable of pregnancy.   Sexuality: Discussed sexually transmitted diseases, partner selection, use of condoms, avoidance of unintended pregnancy  and contraceptive alternatives.   Advised to avoid cigarette smoking.  I discussed with the patient that most people either abstain from alcohol or drink within safe limits (<=14/week and <=4 drinks/occasion for males, <=7/weeks and <= 3 drinks/occasion for females) and that the risk for alcohol disorders and other health effects rises proportionally with the number of drinks per week and how often a drinker exceeds daily limits.  Discussed cessation/primary prevention of drug use and availability  of treatment for abuse.   Diet: Encouraged to adjust caloric intake to maintain  or achieve ideal body weight, to reduce intake of dietary saturated fat and total fat, to limit sodium intake by avoiding high sodium foods and not adding table salt, and to maintain adequate dietary potassium and calcium preferably from fresh fruits, vegetables, and low-fat dairy products.    stressed the importance of regular exercise  Injury prevention: Discussed safety belts, safety helmets, smoke detector, smoking near bedding or upholstery.   Dental health: Discussed importance of regular tooth brushing, flossing, and dental visits.    NEXT PREVENTATIVE PHYSICAL DUE IN 1 YEAR.   Follow up plan: Return in about 1 year (around 02/01/2024) for CPE.

## 2023-02-01 NOTE — Assessment & Plan Note (Signed)
Chronic, stable.  Continue sertraline 50 mg daily.  Follow-up with any concerns.

## 2023-02-01 NOTE — Patient Instructions (Signed)
It was great to see you!  Keep up the great work!   Let me know if you need anything  Let's follow-up in 1 year, sooner if you have concerns.  If a referral was placed today, you will be contacted for an appointment. Please note that routine referrals can sometimes take up to 3-4 weeks to process. Please call our office if you haven't heard anything after this time frame.  Take care,  Rodman Pickle, NP

## 2023-02-19 ENCOUNTER — Ambulatory Visit: Payer: Managed Care, Other (non HMO) | Admitting: Nurse Practitioner

## 2023-03-15 ENCOUNTER — Ambulatory Visit: Payer: BC Managed Care – PPO | Admitting: Internal Medicine

## 2023-03-15 NOTE — Progress Notes (Deleted)
  [facility] [address] [place] [address] Dept: [phone] Dept Fax: [phone]  Acute Care Office Visit  Subjective:   Allison King 18-Nov-1985 03/15/2023  No chief complaint on file.   HPI: Discussed the use of AI scribe software for clinical note transcription with the patient, who gave verbal consent to proceed.  History of Present Illness            The following portions of the patient's history were reviewed and updated as appropriate: past medical history, past surgical history, family history, social history, allergies, medications, and problem list.   Patient Active Problem List   Diagnosis Date Noted   Routine general medical examination at a health care facility 02/01/2023   Depression, recurrent (HCC) 02/01/2023   GAD (generalized anxiety disorder) 09/06/2018   History of iron deficiency anemia 09/21/2017   Past Medical History:  Diagnosis Date   Depression 2022   Ongoing since teens   Ovarian cyst    No past surgical history on file. Family History  Problem Relation Age of Onset   Cancer Mother        breast   Miscarriages / [place] Mother    Depression Maternal Grandmother    Arthritis Paternal Grandmother    Alcohol abuse Paternal Grandfather    Outpatient Medications Prior to Visit  Medication Sig Dispense Refill   Prenatal Vit-Fe Fumarate-FA (PRENATAL MULTIVITAMIN) TABS tablet Take 1 tablet by mouth daily at 12 noon.     sertraline (ZOLOFT) 50 MG tablet Take 50 mg by mouth daily.     No facility-administered medications prior to visit.   Allergies  Allergen Reactions   Sulfa Antibiotics      ROS: A complete ROS was performed with pertinent positives/negatives noted in the HPI. The remainder of the ROS are negative.    Objective:   There were no vitals filed for this visit.  GENERAL: Well-appearing, in NAD. Well nourished.  SKIN: Pink, warm and dry. No  rash, lesion, ulceration, or ecchymoses.  NECK: Trachea midline. Full ROM w/o pain or tenderness.  RESPIRATORY: Chest wall symmetrical. Respirations even and non-labored.  CARDIAC: S1, S2 present, regular rate and rhythm. Peripheral pulses 2+ bilaterally.  MSK: Muscle tone and strength appropriate for age.  EXTREMITIES: Without clubbing, cyanosis, or edema.  NEUROLOGIC: No motor or sensory deficits. Steady, even gait.  PSYCH/MENTAL STATUS: Alert, oriented x 3. Cooperative, appropriate mood and affect.    No results found for any visits on 03/15/23.    Assessment & Plan:  Assessment and Plan             No orders of the defined types were placed in this encounter.  Lab Orders  No laboratory test(s) ordered today   No images are attached to the encounter or orders placed in the encounter.  No follow-ups on file.   Of note, portions of this note may have been created with voice recognition software Physicist, medical). While this note has been edited for accuracy, occasional wrong-word or 'sound-a-like' substitutions may have occurred due to the inherent limitations of voice recognition software.  Salvatore Decent, FNP

## 2023-03-21 ENCOUNTER — Ambulatory Visit (INDEPENDENT_AMBULATORY_CARE_PROVIDER_SITE_OTHER): Payer: BC Managed Care – PPO

## 2023-03-21 ENCOUNTER — Encounter: Payer: Self-pay | Admitting: Internal Medicine

## 2023-03-21 ENCOUNTER — Ambulatory Visit (INDEPENDENT_AMBULATORY_CARE_PROVIDER_SITE_OTHER): Payer: BC Managed Care – PPO | Admitting: Internal Medicine

## 2023-03-21 VITALS — BP 108/64 | HR 72 | Temp 98.7°F | Ht 65.0 in | Wt 121.2 lb

## 2023-03-21 DIAGNOSIS — Z3A01 Less than 8 weeks gestation of pregnancy: Secondary | ICD-10-CM

## 2023-03-21 DIAGNOSIS — O219 Vomiting of pregnancy, unspecified: Secondary | ICD-10-CM

## 2023-03-21 DIAGNOSIS — S59901A Unspecified injury of right elbow, initial encounter: Secondary | ICD-10-CM

## 2023-03-21 NOTE — Progress Notes (Signed)
[facility] [address] [place] [address] Dept: [phone] Dept Fax: [phone]  New Patient Office Visit  Subjective:   Allison King Feb 12, 1986 03/21/2023  Chief Complaint  Patient presents with   Elbow Injury   Nausea    1st pregnancy    HPI: Allison King presents today to establish care at [facility]. Introduced to Publishing rights manager role and practice setting.  All questions answered.  Concerns: See below   Discussed the use of AI scribe software for clinical note transcription with the patient, who gave verbal consent to proceed.  History of Present Illness   The patient presents with right elbow pain. The pain began three weeks ago after a fall down the last five or six steps of a staircase while on vacation. The patient was holding their toddler and wearing socks at the time of the fall. She landed on her right side, sustaining a large bruise on her hip and injuring her right elbow. The hip bruise has improved, but the elbow pain persists. The pain is not constant but is triggered by certain movements, such as lifting or pulling off clothes. The patient has noticed some swelling in the elbow and feels that one side of the joint may be protruding more than the other. There is no numbness or tingling in the arm. The patient has been managing the pain with occasional ibuprofen and ice. She recently found out she is pregnant, approximately 6 weeks.  She does have an appointment with OB/GYN next week.  She does report persistent nausea despite over-the-counter and alternative remedies.      The following portions of the patient's history were reviewed and updated as appropriate: past medical history, past surgical history, family history, social history, allergies, medications, and problem list.   Patient Active Problem List   Diagnosis Date Noted   Routine general  medical examination at a health care facility 02/01/2023   Depression, recurrent (HCC) 02/01/2023   GAD (generalized anxiety disorder) 09/06/2018   History of iron deficiency anemia 09/21/2017   Past Medical History:  Diagnosis Date   Depression 2022   Ongoing since teens   Ovarian cyst    History reviewed. No pertinent surgical history. Family History  Problem Relation Age of Onset   Cancer Mother        breast   Miscarriages / [place] Mother    Depression Maternal Grandmother    Arthritis Paternal Grandmother    Alcohol abuse Paternal Grandfather    Outpatient Medications Prior to Visit  Medication Sig Dispense Refill   Prenatal Vit-Fe Fumarate-FA (PRENATAL MULTIVITAMIN) TABS tablet Take 1 tablet by mouth daily at 12 noon.     sertraline (ZOLOFT) 50 MG tablet Take 50 mg by mouth daily.     No facility-administered medications prior to visit.   Allergies  Allergen Reactions   Sulfa Antibiotics     ROS: A complete ROS was performed with pertinent positives/negatives noted in the HPI. The remainder of the ROS are negative.   Objective:   Today's Vitals   03/21/23 1400  BP: 108/64  Pulse: 72  Temp: 98.7 F (37.1 C)  TempSrc: Temporal  SpO2: 99%  Weight: 121 lb 3.2 oz (55 kg)  Height: 5\' 5"  (1.651 m)    GENERAL: Well-appearing, in NAD. Well nourished.  SKIN: Pink, warm and dry.  Ecchymosis to medial aspect of right elbow NECK: Trachea midline. Full ROM w/o pain or tenderness. No lymphadenopathy.  RESPIRATORY: Chest wall symmetrical. Respirations even and non-labored. Breath sounds clear to auscultation bilaterally.  CARDIAC: S1, S2 present, regular rate and rhythm. Peripheral pulses 2+ bilaterally.  MSK: Muscle tone and strength appropriate for age.  Full range of motion to right elbow, tenderness and mild swelling to olecranon region EXTREMITIES: Without clubbing, cyanosis, or edema.  NEUROLOGIC: No motor or sensory deficits. Steady, even gait.  PSYCH/MENTAL  STATUS: Alert, oriented x 3. Cooperative, appropriate mood and affect.   Health Maintenance Due  Topic Date Due   Hepatitis C Screening  Never done    No results found for any visits on 03/21/23.  Assessment & Plan:  Assessment and Plan    Right Elbow Injury: Fall down stairs 3 weeks ago with persistent pain and swelling. No numbness or tingling. Pain with certain movements. Possible small fracture. -Order elbow X-ray today with shielding due to pregnancy. -Continue ice and Tylenol for pain. -If fracture is confirmed, refer to orthopedics for further management.  Nausea in Pregnancy: Early pregnancy with first OB appointment next week.  -Continue prenatal care appt as planned. -Discuss anti-nausea medication options with OB at next appointment.      Return if symptoms worsen or fail to improve.   Of note, portions of this note may have been created with voice recognition software Physicist, medical). While this note has been edited for accuracy, occasional wrong-word or 'sound-a-like' substitutions may have occurred due to the inherent limitations of voice recognition software.  Salvatore Decent, FNP

## 2023-03-21 NOTE — Patient Instructions (Signed)
I will be in contact with you about your xray results

## 2023-03-28 LAB — OB RESULTS CONSOLE RUBELLA ANTIBODY, IGM: Rubella: IMMUNE

## 2023-03-28 LAB — OB RESULTS CONSOLE RPR: RPR: NONREACTIVE

## 2023-03-28 LAB — OB RESULTS CONSOLE HEPATITIS B SURFACE ANTIGEN: Hepatitis B Surface Ag: NEGATIVE

## 2023-03-28 LAB — OB RESULTS CONSOLE HIV ANTIBODY (ROUTINE TESTING): HIV: NONREACTIVE

## 2023-08-22 NOTE — L&D Delivery Note (Signed)
 OB/GYN [facility] Delivery Note  Allison King is a 38 y.o. G3P1011 s/p VD at [redacted]w[redacted]d. She was admitted for SOL, active.   ROM: 1h 56m with clear fluid GBS Status: Negative   Maximum Maternal Temperature: 98.2  Labor Progress: Initial SVE: 7/80/+1. She then progressed to complete.   Delivery Date/Time: 10/25/23 @1832  Delivery: Called to room and patient was complete and pushing. Head delivered OP. No nuchal cord present. Shoulder and body delivered in usual fashion. Infant with spontaneous cry, placed on mother's abdomen, dried and stimulated. Cord clamped x 2 after 1-minute delay, and cut by FOB. Cord blood drawn. Placenta delivered spontaneously with gentle cord traction. Fundus firm with massage and Pitocin. Labia, perineum, vagina, and cervix inspected. There was a 1st degree perineal laceration that was hemostatic but repaired for approximation healing.  Baby Weight: pending  Placenta: 3 vessel, intact. Sent to L&D Complications: None Lacerations: 1 st degree 3.0 vicryl repair EBL: 156 mL Analgesia: Epidural   Infant:  APGAR (1 MIN):  9 APGAR (5 MINS):  9  Nicolette Gieske Autry-Lott, DO 10/25/2023, 6:59 PM

## 2023-10-05 ENCOUNTER — Encounter (HOSPITAL_COMMUNITY): Payer: Self-pay | Admitting: *Deleted

## 2023-10-05 ENCOUNTER — Inpatient Hospital Stay (HOSPITAL_COMMUNITY)
Admission: AD | Admit: 2023-10-05 | Discharge: 2023-10-05 | Disposition: A | Discharge status: Home / Self Care | Payer: BC Managed Care – PPO | Attending: Obstetrics & Gynecology | Admitting: Obstetrics & Gynecology

## 2023-10-05 DIAGNOSIS — O4693 Antepartum hemorrhage, unspecified, third trimester: Secondary | ICD-10-CM | POA: Insufficient documentation

## 2023-10-05 DIAGNOSIS — N930 Postcoital and contact bleeding: Secondary | ICD-10-CM | POA: Diagnosis present

## 2023-10-05 DIAGNOSIS — Z3A35 35 weeks gestation of pregnancy: Secondary | ICD-10-CM | POA: Insufficient documentation

## 2023-10-05 HISTORY — DX: Anemia, unspecified: D64.9

## 2023-10-05 NOTE — MAU Note (Signed)
Allison King is a 38 y.o. at [redacted]w[redacted]d here in MAU reporting: has experienced bleeding since 0400.  Not filling up a pad, but is bright red.  This occurred after having sex with her spouse.  (Moderate initially, now light, no clots). Little cramping.  Reports +FM  Onset of complaint: 0400 Pain score: mild cramping.  Vitals:   10/05/23 1041  BP: 117/67  Pulse: 87  Resp: 18  Temp: 98.3 F (36.8 C)  SpO2: 98%     FHT:168 Lab orders placed from triage:

## 2023-10-05 NOTE — MAU Provider Note (Addendum)
hief Complaint:  Abdominal Pain and Vaginal Bleeding   Event Date/Time   First Provider Initiated Contact with Patient 10/05/23 1111      HPI: Allison King is a 38 y.o. G3P1011 at [redacted]w[redacted]d by LMP who presents to maternity admissions reporting vaginal bleeding. She reports good fetal movement, denies LOF, vaginal bleeding, vaginal itching/burning, urinary symptoms, h/a, dizziness, n/v, or fever/chills.       Abdominal Pain The primary symptoms of the illness include vaginal bleeding. The primary symptoms of the illness do not include abdominal pain, nausea, vomiting or dysuria.  Vaginal Bleeding Pertinent negatives include no abdominal pain, nausea or vomiting.    Past Medical History: Past Medical History:  Diagnosis Date   Anemia    Depression 2022   Ongoing since teens   Ovarian cyst     Past obstetric history: OB History  Gravida Para Term Preterm AB Living  3 1 1  1 1   SAB IAB Ectopic Multiple Live Births  1    1    # Outcome Date GA Lbr Len/2nd Weight Sex Type Anes PTL Lv  3 Current           2 Term 02/21/21 [redacted]w[redacted]d   F Vag-Spont  N LIV  1 SAB 2021     SAB       Obstetric Comments  2022- [facility], baby needed surgery    Past Surgical History: Past Surgical History:  Procedure Laterality Date   NO PAST SURGERIES      Family History: Family History  Problem Relation Age of Onset   Cancer Mother        breast   Miscarriages / [place] Mother    Healthy Father    Depression Maternal Grandmother    Arthritis Paternal Grandmother    Alcohol abuse Paternal Grandfather     Social History: Social History   Tobacco Use   Smoking status: Never   Smokeless tobacco: Never  Vaping Use   Vaping status: Never Used  Substance Use Topics   Alcohol use: Not Currently    Comment: I stopped drinking alcohol at age 56.   Drug use: Never    Allergies:  Allergies  Allergen Reactions   Sulfa Antibiotics Swelling    Meds:  Medications Prior to  Admission  Medication Sig Dispense Refill Last Dose/Taking   aspirin 81 MG chewable tablet Chew 81 mg by mouth daily.   10/04/2023   Prenatal Vit-Fe Fumarate-FA (PRENATAL MULTIVITAMIN) TABS tablet Take 1 tablet by mouth daily at 12 noon.   10/04/2023   sertraline (ZOLOFT) 50 MG tablet Take 50 mg by mouth daily.   10/04/2023    ROS:  Review of Systems  Gastrointestinal:  Negative for abdominal pain, nausea and vomiting.  Genitourinary:  Positive for vaginal bleeding. Negative for difficulty urinating and dysuria.     I have reviewed patient's Past Medical Hx, Surgical Hx, Family Hx, Social Hx, medications and allergies.   Physical Exam  Patient Vitals for the past 24 hrs:  BP Temp Temp src Pulse Resp SpO2 Height Weight  10/05/23 1240 125/67 98.2 F (36.8 C) Oral 71 16 -- -- --  10/05/23 1235 -- -- -- -- -- 100 % -- --  10/05/23 1230 -- -- -- -- -- 98 % -- --  10/05/23 1225 -- -- -- -- -- 97 % -- --  10/05/23 1220 -- -- -- -- -- 98 % -- --  10/05/23 1215 -- -- -- -- -- 98 % -- --  10/05/23 1210 -- -- -- -- -- 97 % -- --  10/05/23 1205 -- -- -- -- -- 98 % -- --  10/05/23 1200 -- -- -- -- -- 98 % -- --  10/05/23 1155 -- -- -- -- -- 98 % -- --  10/05/23 1150 -- -- -- -- -- 97 % -- --  10/05/23 1145 -- -- -- -- -- 99 % -- --  10/05/23 1140 -- -- -- -- -- 99 % -- --  10/05/23 1135 -- -- -- -- -- 98 % -- --  10/05/23 1130 -- -- -- -- -- 98 % -- --  10/05/23 1125 -- -- -- -- -- 98 % -- --  10/05/23 1120 -- -- -- -- -- 98 % -- --  10/05/23 1115 -- -- -- -- -- 99 % -- --  10/05/23 1110 -- -- -- -- -- 98 % -- --  10/05/23 1105 -- -- -- -- -- 98 % -- --  10/05/23 1100 -- -- -- -- -- 98 % -- --  10/05/23 1055 -- -- -- -- -- 99 % -- --  10/05/23 1041 117/67 98.3 F (36.8 C) Oral 87 18 98 % 5\' 5"  (1.651 m) 69.7 kg   Constitutional: Well-developed, well-nourished female in no acute distress.  Cardiovascular: normal rate Respiratory: normal effort GI: Abd soft, non-tender, gravid  appropriate for gestational age.  MS: Extremities nontender, no edema, normal ROM Neurologic: Alert and oriented x 4.  GU: Neg CVAT.  PELVIC EXAM: Cervix red and friable, visually closed, without lesion, scant red blood mixed with mucous, vaginal walls and external genitalia normal Bimanual exam: deferred   FHT:  Baseline 145, moderate variability, accelerations present, no decelerations Contractions: irritability   Labs: No results found for this or any previous visit (from the past 24 hours).    Imaging:  No results found.  MAU Course/MDM: Orders Placed This Encounter  Procedures   Discharge patient Discharge disposition: 01-Home or Self Care; Discharge patient date: 10/05/2023    No orders of the defined types were placed in this encounter.    NST reviewed Pt discharge with strict bleeding/return precautions.    Assessment: 1. [redacted] weeks gestation of pregnancy   2. Vaginal bleeding in pregnancy, third trimester     Plan: Discharge home Labor precautions and fetal kick counts  Follow-up Information     [facility] Follow up.   Specialty: Obstetrics and Gynecology Why: for emergency Contact information: [address] [place] [address] [phone]        [facility] Obstetrics [facility] Gynecology Follow up.   Why: as scheduled               Allergies as of 10/05/2023       Reactions   Sulfa Antibiotics Swelling        Medication List     TAKE these medications    aspirin 81 MG chewable tablet Chew 81 mg by mouth daily.   prenatal multivitamin Tabs tablet Take 1 tablet by mouth daily at 12 noon.   sertraline 50 MG tablet Commonly known as: ZOLOFT Take 50 mg by mouth daily.        Zenia Resides, FNP-Student  10/05/2023 12:58 PM   CNM attestation:  I have seen and examined this patient and agree with above documentation in the NP student's note.   Allison King is a 38 y.o. G3P1011 at  [redacted]w[redacted]d reporting vaginal bleeding after intercourse today.  She has had bleeding with intercourse before but not this much.  The bleeding is heavy enough to require a pad but not soaking pads. It has become lighter and lighter since onset.  +FM, denies LOF, contractions, vaginal discharge.  PE: Patient Vitals for the past 24 hrs:  BP Temp Temp src Pulse Resp SpO2 Height Weight  10/05/23 1240 125/67 98.2 F (36.8 C) Oral 71 16 -- -- --  10/05/23 1235 -- -- -- -- -- 100 % -- --  10/05/23 1230 -- -- -- -- -- 98 % -- --  10/05/23 1225 -- -- -- -- -- 97 % -- --  10/05/23 1220 -- -- -- -- -- 98 % -- --  10/05/23 1215 -- -- -- -- -- 98 % -- --  10/05/23 1210 -- -- -- -- -- 97 % -- --  10/05/23 1205 -- -- -- -- -- 98 % -- --  10/05/23 1200 -- -- -- -- -- 98 % -- --  10/05/23 1155 -- -- -- -- -- 98 % -- --  10/05/23 1150 -- -- -- -- -- 97 % -- --  10/05/23 1145 -- -- -- -- -- 99 % -- --  10/05/23 1140 -- -- -- -- -- 99 % -- --  10/05/23 1135 -- -- -- -- -- 98 % -- --  10/05/23 1130 -- -- -- -- -- 98 % -- --  10/05/23 1125 -- -- -- -- -- 98 % -- --  10/05/23 1120 -- -- -- -- -- 98 % -- --  10/05/23 1115 -- -- -- -- -- 99 % -- --  10/05/23 1110 -- -- -- -- -- 98 % -- --  10/05/23 1105 -- -- -- -- -- 98 % -- --  10/05/23 1100 -- -- -- -- -- 98 % -- --  10/05/23 1055 -- -- -- -- -- 99 % -- --  10/05/23 1041 117/67 98.3 F (36.8 C) Oral 87 18 98 % 5\' 5"  (1.651 m) 69.7 kg   Gen: calm comfortable, NAD Resp: normal effort, no distress Heart: Regular rate Abd: Soft, NT, gravid, S=D  FHR: Baseline 145, moderate Variability, pos accels, no decels Toco: irritability, mild to palpation  ROS, labs, PMH reviewed  Orders Placed This Encounter  Procedures   Discharge patient Discharge disposition: 01-Home or Self Care; Discharge patient date: 10/05/2023   No orders of the defined types were placed in this encounter.   MDM --Pt with bleeding immediately after intercourse, visible friable cervix  on exam, and bleeding becoming less and less since onset.  FHR tracing reactive.  Pt without pain.  D/C home with bleeding precautions. Keep scheduled appts.   Assessment: 1. [redacted] weeks gestation of pregnancy   2. Vaginal bleeding in pregnancy, third trimester     Plan: - Discharge home - Return to maternity admissions symptoms worsen  Kendell Bane 10/05/2023 8:41 PM

## 2023-10-25 ENCOUNTER — Inpatient Hospital Stay (HOSPITAL_COMMUNITY): Admitting: Anesthesiology

## 2023-10-25 ENCOUNTER — Inpatient Hospital Stay (HOSPITAL_COMMUNITY)
Admission: AD | Admit: 2023-10-25 | Discharge: 2023-10-27 | DRG: 807 | Disposition: A | Discharge status: Home / Self Care | Attending: Family Medicine | Admitting: Family Medicine

## 2023-10-25 ENCOUNTER — Encounter (HOSPITAL_COMMUNITY): Payer: Self-pay | Admitting: Family Medicine

## 2023-10-25 DIAGNOSIS — O99344 Other mental disorders complicating childbirth: Secondary | ICD-10-CM | POA: Diagnosis present

## 2023-10-25 DIAGNOSIS — Z79899 Other long term (current) drug therapy: Secondary | ICD-10-CM | POA: Diagnosis not present

## 2023-10-25 DIAGNOSIS — Z7982 Long term (current) use of aspirin: Secondary | ICD-10-CM | POA: Diagnosis not present

## 2023-10-25 DIAGNOSIS — F411 Generalized anxiety disorder: Secondary | ICD-10-CM | POA: Diagnosis present

## 2023-10-25 DIAGNOSIS — R030 Elevated blood-pressure reading, without diagnosis of hypertension: Secondary | ICD-10-CM | POA: Diagnosis not present

## 2023-10-25 DIAGNOSIS — F32A Depression, unspecified: Secondary | ICD-10-CM | POA: Diagnosis present

## 2023-10-25 DIAGNOSIS — Z3A38 38 weeks gestation of pregnancy: Secondary | ICD-10-CM | POA: Diagnosis not present

## 2023-10-25 DIAGNOSIS — O26893 Other specified pregnancy related conditions, third trimester: Secondary | ICD-10-CM | POA: Diagnosis present

## 2023-10-25 LAB — CBC
HCT: 37.2 % (ref 36.0–46.0)
Hemoglobin: 12.2 g/dL (ref 12.0–15.0)
MCH: 28 pg (ref 26.0–34.0)
MCHC: 32.8 g/dL (ref 30.0–36.0)
MCV: 85.5 fL (ref 80.0–100.0)
Platelets: 220 10*3/uL (ref 150–400)
RBC: 4.35 MIL/uL (ref 3.87–5.11)
RDW: 13.6 % (ref 11.5–15.5)
WBC: 11.8 10*3/uL — ABNORMAL HIGH (ref 4.0–10.5)
nRBC: 0.2 % (ref 0.0–0.2)

## 2023-10-25 LAB — TYPE AND SCREEN
ABO/RH(D): B POS
Antibody Screen: NEGATIVE

## 2023-10-25 LAB — HIV ANTIBODY (ROUTINE TESTING W REFLEX): HIV Screen 4th Generation wRfx: NONREACTIVE

## 2023-10-25 MED ORDER — FLEET ENEMA RE ENEM: 1.0000 | ENEMA | RECTAL | Status: DC | PRN

## 2023-10-25 MED ORDER — LACTATED RINGERS IV SOLN: INTRAVENOUS | Status: AC

## 2023-10-25 MED ORDER — LIDOCAINE HCL (PF) 1 % IJ SOLN: 30.0000 mL | INTRAMUSCULAR | Status: DC | PRN

## 2023-10-25 MED ORDER — SIMETHICONE 80 MG PO CHEW: 80.0000 mg | CHEWABLE_TABLET | ORAL | Status: DC | PRN

## 2023-10-25 MED ORDER — DIPHENHYDRAMINE HCL 50 MG/ML IJ SOLN: 12.5000 mg | INTRAMUSCULAR | Status: DC | PRN

## 2023-10-25 MED ORDER — ACETAMINOPHEN 325 MG PO TABS
650.0000 mg | ORAL_TABLET | ORAL | Status: DC | PRN
  Administered 2023-10-26 – 2023-10-27 (×3): 650 mg via ORAL
  Filled 2023-10-25 (×3): qty 2

## 2023-10-25 MED ORDER — OXYTOCIN-SODIUM CHLORIDE 30-0.9 UT/500ML-% IV SOLN
2.5000 [IU]/h | INTRAVENOUS | Status: DC
  Filled 2023-10-25: qty 500

## 2023-10-25 MED ORDER — LACTATED RINGERS IV SOLN
500.0000 mL | Freq: Once | INTRAVENOUS | Status: AC
  Administered 2023-10-25: 500 mL via INTRAVENOUS

## 2023-10-25 MED ORDER — LIDOCAINE HCL (PF) 1 % IJ SOLN
INTRAMUSCULAR | Status: DC | PRN
  Administered 2023-10-25: 4 mL via EPIDURAL
  Administered 2023-10-25: 5 mL via EPIDURAL

## 2023-10-25 MED ORDER — FENTANYL-BUPIVACAINE-NACL 0.5-0.125-0.9 MG/250ML-% EP SOLN
12.0000 mL/h | EPIDURAL | Status: DC | PRN
  Administered 2023-10-25: 12 mL/h via EPIDURAL
  Filled 2023-10-25: qty 250

## 2023-10-25 MED ORDER — COCONUT OIL OIL
1.0000 | TOPICAL_OIL | Status: DC | PRN
  Administered 2023-10-26: 1 via TOPICAL

## 2023-10-25 MED ORDER — ONDANSETRON HCL 4 MG/2ML IJ SOLN: 4.0000 mg | INTRAMUSCULAR | Status: DC | PRN

## 2023-10-25 MED ORDER — LACTATED RINGERS IV SOLN: 500.0000 mL | INTRAVENOUS | Status: DC | PRN

## 2023-10-25 MED ORDER — IBUPROFEN 600 MG PO TABS
600.0000 mg | ORAL_TABLET | Freq: Four times a day (QID) | ORAL | Status: DC
  Administered 2023-10-26 – 2023-10-27 (×7): 600 mg via ORAL
  Filled 2023-10-25 (×7): qty 1

## 2023-10-25 MED ORDER — OXYCODONE-ACETAMINOPHEN 5-325 MG PO TABS: 1.0000 | ORAL_TABLET | ORAL | Status: DC | PRN

## 2023-10-25 MED ORDER — EPHEDRINE 5 MG/ML INJ: 10.0000 mg | INTRAVENOUS | Status: DC | PRN

## 2023-10-25 MED ORDER — PRENATAL MULTIVITAMIN CH
1.0000 | ORAL_TABLET | Freq: Every day | ORAL | Status: DC
  Administered 2023-10-26 – 2023-10-27 (×2): 1 via ORAL
  Filled 2023-10-25 (×2): qty 1

## 2023-10-25 MED ORDER — WITCH HAZEL-GLYCERIN EX PADS
1.0000 | MEDICATED_PAD | CUTANEOUS | Status: DC | PRN
  Administered 2023-10-26: 1 via TOPICAL

## 2023-10-25 MED ORDER — BENZOCAINE-MENTHOL 20-0.5 % EX AERO
1.0000 | INHALATION_SPRAY | CUTANEOUS | Status: DC | PRN
  Administered 2023-10-26: 1 via TOPICAL
  Filled 2023-10-25: qty 56

## 2023-10-25 MED ORDER — ONDANSETRON HCL 4 MG PO TABS: 4.0000 mg | ORAL_TABLET | ORAL | Status: DC | PRN

## 2023-10-25 MED ORDER — DIBUCAINE (PERIANAL) 1 % EX OINT: 1.0000 | TOPICAL_OINTMENT | CUTANEOUS | Status: DC | PRN

## 2023-10-25 MED ORDER — ONDANSETRON HCL 4 MG/2ML IJ SOLN: 4.0000 mg | Freq: Four times a day (QID) | INTRAMUSCULAR | Status: DC | PRN

## 2023-10-25 MED ORDER — ACETAMINOPHEN 325 MG PO TABS: 650.0000 mg | ORAL_TABLET | ORAL | Status: DC | PRN

## 2023-10-25 MED ORDER — TETANUS-DIPHTH-ACELL PERTUSSIS 5-2.5-18.5 LF-MCG/0.5 IM SUSY: 0.5000 mL | PREFILLED_SYRINGE | Freq: Once | INTRAMUSCULAR | Status: DC

## 2023-10-25 MED ORDER — OXYCODONE-ACETAMINOPHEN 5-325 MG PO TABS: 2.0000 | ORAL_TABLET | ORAL | Status: DC | PRN

## 2023-10-25 MED ORDER — SENNOSIDES-DOCUSATE SODIUM 8.6-50 MG PO TABS
2.0000 | ORAL_TABLET | Freq: Every day | ORAL | Status: DC
  Administered 2023-10-26 – 2023-10-27 (×2): 2 via ORAL
  Filled 2023-10-25 (×2): qty 2

## 2023-10-25 MED ORDER — PHENYLEPHRINE 80 MCG/ML (10ML) SYRINGE FOR IV PUSH (FOR BLOOD PRESSURE SUPPORT): 80.0000 ug | PREFILLED_SYRINGE | INTRAVENOUS | Status: DC | PRN

## 2023-10-25 MED ORDER — ZOLPIDEM TARTRATE 5 MG PO TABS: 5.0000 mg | ORAL_TABLET | Freq: Every evening | ORAL | Status: DC | PRN

## 2023-10-25 MED ORDER — OXYTOCIN BOLUS FROM INFUSION
333.0000 mL | Freq: Once | INTRAVENOUS | Status: AC
  Administered 2023-10-25: 333 mL via INTRAVENOUS

## 2023-10-25 MED ORDER — SOD CITRATE-CITRIC ACID 500-334 MG/5ML PO SOLN: 30.0000 mL | ORAL | Status: DC | PRN

## 2023-10-25 MED ORDER — DIPHENHYDRAMINE HCL 25 MG PO CAPS: 25.0000 mg | ORAL_CAPSULE | Freq: Four times a day (QID) | ORAL | Status: DC | PRN

## 2023-10-25 NOTE — H&P (Signed)
 OBSTETRIC ADMISSION HISTORY AND PHYSICAL  Allison King is a 38 y.o. female G3P1011 with IUP at [redacted]w[redacted]d by LMP presenting for SOL CTX started 3 hours prior to arrival. She reports +FMs, No LOF, no VB, no blurry vision, headaches or peripheral edema, and RUQ pain. She request nexplanon, mirena/paragard IUD for birth control. She received her prenatal care at [facility] w/ Dr. Connye Burkitt  Dating: By LMP --->  Estimated Date of Delivery: 11/08/23  Sono:    @[redacted]w[redacted]d , CWD, normal anatomy, breech presentation, posterior placental lie, 309g, 54% EFW   Prenatal History/Complications:  -AMA -Anxiety/Depression -UTI -Left ovarian cyst -Hx of infant with fetal CPAM  Past Medical History: Past Medical History:  Diagnosis Date   Anemia    Depression 2022   Ongoing since teens   Ovarian cyst     Past Surgical History: Past Surgical History:  Procedure Laterality Date   NO PAST SURGERIES      Obstetrical History: OB History     Gravida  3   Para  1   Term  1   Preterm      AB  1   Living  1      SAB  1   IAB      Ectopic      Multiple      Live Births  1        Obstetric Comments  2022- [facility], baby needed surgery         Social History Social History   Socioeconomic History   Marital status: Married    Spouse name: John Pardini   Number of children: 1   Years of education: Not on file   Highest education level: Not on file  Occupational History   Not on file  Tobacco Use   Smoking status: Never   Smokeless tobacco: Never  Vaping Use   Vaping status: Never Used  Substance and Sexual Activity   Alcohol use: Not Currently    Comment: I stopped drinking alcohol at age 54.   Drug use: Never   Sexual activity: Yes    Partners: Male    Birth control/protection: None  Other Topics Concern   Not on file  Social History Narrative   Not on file   Social Drivers of Health   Financial Resource Strain: Not on file  Food Insecurity: Not on  file  Transportation Needs: No Transportation Needs (02/22/2021)   Received from [facility]   PRAPARE - Transportation    In the past 12 months, has lack of transportation kept you from medical appointments or from getting medications?: No    Lack of Transportation (Non-Medical): No  Physical Activity: Not on file  Stress: Not on file  Social Connections: Not on file    Family History: Family History  Problem Relation Age of Onset   Cancer Mother        breast   Miscarriages / [place] Mother    Healthy Father    Depression Maternal Grandmother    Arthritis Paternal Grandmother    Alcohol abuse Paternal Grandfather     Allergies: Allergies  Allergen Reactions   Sulfa Antibiotics Swelling    Medications Prior to Admission  Medication Sig Dispense Refill Last Dose/Taking   aspirin 81 MG chewable tablet Chew 81 mg by mouth daily.   Past Week   Prenatal Vit-Fe Fumarate-FA (PRENATAL MULTIVITAMIN) TABS tablet Take 1 tablet by mouth daily at 12 noon.  10/25/2023   sertraline (ZOLOFT) 50 MG tablet Take 50 mg by mouth daily.   10/24/2023     Review of Systems   All systems reviewed and negative except as stated in HPI  Blood pressure 117/61, pulse 66, height 5\' 6"  (1.676 m), weight 72.2 kg, last menstrual period 01/01/2023, SpO2 100%. General appearance: alert and no distress Lungs: clear to auscultation bilaterally Heart: regular rate  Abdomen:appropriately gravid for gestational age Extremities: No LE edema Presentation: cephalic Fetal monitoringBaseline: 150 bpm, Variability: Good {> 6 bpm), Accelerations: Reactive, and Decelerations: Absent Uterine activity every 1-2 mins Dilation: 10 Effacement (%): 100 Station: Plus 1 Exam by:: Dr. Salvadore Dom   Prenatal labs: ABO, Rh: --/--/PENDING (03/06 1340) Antibody: PENDING (03/06 1340) Rubella:  Immune RPR:   NR HBsAg:   Negative HIV:   NR GBS:   Negative 1 hr Glucola  111 Genetic screening  LR female Anatomy US wnl  Prenatal Transfer Tool  Maternal Diabetes: No Genetic Screening: Normal Maternal Ultrasounds/Referrals: Normal Fetal Ultrasounds or other Referrals:  None Maternal Substance Abuse:  No Significant Maternal Medications:  None Significant Maternal Lab Results:  Group B Strep negative Number of Prenatal Visits:greater than 3 verified prenatal visits Other Comments:   Hx of infant with CPAM  Results for orders placed or performed during the hospital encounter of 10/25/23 (from the past 24 hours)  CBC   Collection Time: 10/25/23  1:40 PM  Result Value Ref Range   WBC 11.8 (H) 4.0 - 10.5 K/uL   RBC 4.35 3.87 - 5.11 MIL/uL   Hemoglobin 12.2 12.0 - 15.0 g/dL   HCT 16.1 09.6 - 04.5 %   MCV 85.5 80.0 - 100.0 fL   MCH 28.0 26.0 - 34.0 pg   MCHC 32.8 30.0 - 36.0 g/dL   RDW 40.9 81.1 - 91.4 %   Platelets 220 150 - 400 K/uL   nRBC 0.2 0.0 - 0.2 %  Type and screen [facility]   Collection Time: 10/25/23  1:40 PM  Result Value Ref Range   ABO/RH(D) PENDING    Antibody Screen PENDING    Sample Expiration      10/28/2023,2359 Performed at [facility] Lab, [address], [place], [place] [address]     Patient Active Problem List   Diagnosis Date Noted   Normal labor 10/25/2023   Routine general medical examination at a health care facility 02/01/2023   Depression, recurrent (HCC) 02/01/2023   GAD (generalized anxiety disorder) 09/06/2018   History of iron deficiency anemia 09/21/2017    Assessment/Plan:  DAVITA SUBLETT Pardini is a 38 y.o. G3P1011 at [redacted]w[redacted]d here for SOL  #Labor: SROM clear fluid about 10 mins prior. Continue expectant management.  #Pain: Epidural #FWB: Cat I  #ID: GBS neg  Khalise Billard Autry-Lott, DO  10/25/2023, 2:26 PM

## 2023-10-25 NOTE — Anesthesia Preprocedure Evaluation (Signed)
 Anesthesia Evaluation  Patient identified by MRN, date of birth, ID band Patient awake    Reviewed: Allergy & Precautions, NPO status , Patient's Chart, lab work & pertinent test results  History of Anesthesia Complications Negative for: history of anesthetic complications  Airway Mallampati: III  TM Distance: >3 FB Neck ROM: Full    Dental   Pulmonary neg pulmonary ROS   Pulmonary exam normal breath sounds clear to auscultation       Cardiovascular negative cardio ROS  Rhythm:Regular Rate:Normal     Neuro/Psych  PSYCHIATRIC DISORDERS Anxiety Depression    negative neurological ROS     GI/Hepatic negative GI ROS, Neg liver ROS,,,  Endo/Other  negative endocrine ROS    Renal/GU negative Renal ROS     Musculoskeletal   Abdominal   Peds  Hematology  (+) Blood dyscrasia, anemia Lab Results      Component                Value               Date                      WBC                      11.8 (H)            10/25/2023                HGB                      12.2                10/25/2023                HCT                      37.2                10/25/2023                MCV                      85.5                10/25/2023                PLT                      220                 10/25/2023              Anesthesia Other Findings Takes baby aspirin  Reproductive/Obstetrics (+) Pregnancy                              Anesthesia Physical Anesthesia Plan  ASA: 2  Anesthesia Plan: Epidural   Post-op Pain Management:    Induction:   PONV Risk Score and Plan:   Airway Management Planned: Natural Airway  Additional Equipment:   Intra-op Plan:   Post-operative Plan:   Informed Consent: I have reviewed the patients History and Physical, chart, labs and discussed the procedure including the risks, benefits and alternatives for the proposed anesthesia with the patient or  authorized representative who has indicated his/her understanding and acceptance.  Plan Discussed with: Anesthesiologist  Anesthesia Plan Comments: (I have discussed risks of neuraxial anesthesia including but not limited to infection, bleeding, nerve injury, back pain, headache, seizures, and failure of block. Patient denies bleeding disorders and is not currently anticoagulated. Labs have been reviewed. Risks and benefits discussed. All patient's questions answered.  )         Anesthesia Quick Evaluation

## 2023-10-25 NOTE — MAU Note (Signed)
.  Allison King is a 38 y.o. at [redacted]w[redacted]d here in MAU reporting: frequent contractions that became more intense in the last three hours.  Contractions every: "frequent"  Onset of ctx: 3 hours ago Pain score: 5/10  ROM: Denies Vaginal Bleeding: Denies Last SVE: week ago; 4 cm/ 90 Labor Pain Management Plan: Planning epidural ASAP  Fetal Movement: Reports positive FM FHT: Fetal Heart Rate Mode: External Baseline Rate (A): 135 bpm  OB Office: [facility] GBS: Negative Lab orders placed from triage: To call provider

## 2023-10-25 NOTE — Anesthesia Procedure Notes (Signed)
 Epidural Patient location during procedure: OB Start time: 10/25/2023 2:05 PM End time: 10/25/2023 2:10 PM  Staffing Anesthesiologist: Linton Rump, MD Performed: anesthesiologist   Preanesthetic Checklist Completed: patient identified, IV checked, site marked, risks and benefits discussed, surgical consent, monitors and equipment checked, pre-op evaluation and timeout performed  Epidural Patient position: sitting Prep: DuraPrep and site prepped and draped Patient monitoring: continuous pulse ox and blood pressure Approach: midline Location: L3-L4 Injection technique: LOR saline  Needle:  Needle type: Tuohy  Needle gauge: 17 G Needle length: 9 cm and 9 Needle insertion depth: 4.5 cm Catheter type: closed end flexible Catheter size: 19 Gauge Catheter at skin depth: 9 cm Test dose: negative  Assessment Events: blood not aspirated, no cerebrospinal fluid, injection not painful, no injection resistance, no paresthesia and negative IV test  Additional Notes The patient has requested an epidural for labor pain management. Risks and benefits including, but not limited to, infection, bleeding, local anesthetic toxicity, headache, hypotension, back pain, block failure, etc. were discussed with the patient. The patient expressed understanding and consented to the procedure. I confirmed that the patient has no bleeding disorders and is not taking blood thinners. I confirmed the patient's last platelet count with the nurse. A time-out was performed immediately prior to the procedure. Please see nursing documentation for vital signs. Sterile technique was used throughout the whole procedure. Once LOR achieved, the epidural catheter threaded easily without resistance. Aspiration of the catheter was negative for blood and CSF. The epidural was dosed slowly and an infusion was started.  1 attempt(s)Reason for block:procedure for pain

## 2023-10-26 ENCOUNTER — Other Ambulatory Visit: Payer: Self-pay

## 2023-10-26 LAB — RPR: RPR Ser Ql: NONREACTIVE

## 2023-10-26 MED ORDER — SERTRALINE HCL 50 MG PO TABS
50.0000 mg | ORAL_TABLET | Freq: Every day | ORAL | Status: DC
  Administered 2023-10-26 – 2023-10-27 (×2): 50 mg via ORAL
  Filled 2023-10-26 (×2): qty 1

## 2023-10-26 NOTE — Lactation Note (Signed)
 This note was copied from a baby's chart. Lactation Consultation Note  Patient Name: Allison King [id number] Date: 10/26/2023 Age:37 hours  Mom told RN that she doesn't want to be seen by Lactation tonight, would like to wait until tomorrow.   Maternal Data    Feeding    LATCH Score Latch: Grasps breast easily, tongue down, lips flanged, rhythmical sucking.  Audible Swallowing: Spontaneous and intermittent  Type of Nipple: Everted at rest and after stimulation  Comfort (Breast/Nipple): Soft / non-tender  Hold (Positioning): No assistance needed to correctly position infant at breast.  LATCH Score: 10   Lactation Tools Discussed/Used    Interventions    Discharge    Consult Status      Allison King 10/26/2023, 9:24 PM

## 2023-10-26 NOTE — Progress Notes (Signed)
 MOB was referred for history of depression/anxiety.  * Referral screened out by Clinical Social Worker because none of the following criteria appear to apply:  ~ History of anxiety/depression during this pregnancy, or of post-partum depression following prior delivery.  ~ Diagnosis of anxiety and/or depression within last 3 years  OR  * MOB's symptoms currently being treated with medication and/or therapy.  Per OB notes, MOB is prescribed Zoloft 50mg  and participating in virtual therapy.  Please contact the Clinical Social Worker if needs arise, by [facility] request, or if MOB scores greater than 9/yes to question 10 on Edinburgh Postpartum Depression Screen.  Enos Fling, Theresia Majors Clinical Social Worker [phone]

## 2023-10-26 NOTE — Anesthesia Postprocedure Evaluation (Signed)
 Anesthesia Post Note  Patient: Allison King  Procedure(s) Performed: AN AD HOC LABOR EPIDURAL     Patient location during evaluation: Mother Baby Anesthesia Type: Epidural Level of consciousness: awake and alert Pain management: pain level controlled Vital Signs Assessment: post-procedure vital signs reviewed and stable Respiratory status: spontaneous breathing, nonlabored ventilation and respiratory function stable Cardiovascular status: stable Postop Assessment: no headache, no backache, epidural receding, no apparent nausea or vomiting, patient able to bend at knees, able to ambulate and adequate PO intake Anesthetic complications: no   No notable events documented.  Last Vitals:  Vitals:   10/26/23 0230 10/26/23 0500  BP: 121/76 119/72  Pulse: (!) 59 63  Resp: 18 18  Temp: 36.6 C 36.6 C  SpO2:      Last Pain:  Vitals:   10/26/23 0607  TempSrc:   PainSc: 5    Pain Goal:                   Laban Emperor

## 2023-10-26 NOTE — Progress Notes (Addendum)
 Postpartum Note Day #1  S:  Patient doing well.  Pain controlled.  Tolerating regular diet.   Ambulating and voiding without difficulty.  Denies fevers, chills, chest pain, SOB, N/V, or worsening bilateral LE edema.  Lochia: Minimal Infant feeding:  Breast Circumcision:  Desires prior to discharge Contraception:  LARC (Nexplanon vs Mirena or Paragard)  O: Temp:  [97.8 F (36.6 C)-98.5 F (36.9 C)] 97.8 F (36.6 C) (03/07 0500) Pulse Rate:  [58-128] 63 (03/07 0500) Resp:  [16-18] 18 (03/07 0500) BP: (91-148)/(42-92) 119/72 (03/07 0500) SpO2:  [98 %-100 %] 100 % (03/06 1441) Weight:  [72.2 kg] 72.2 kg (03/06 1331) Gen: NAD, pleasant and cooperative CV: Regular rate Resp: Normal work of breathing Abdomen: soft, non-distended, non-tender throughout Uterus: firm, non-tender, below umbilicus Ext: No bilateral LE edema, no bilateral calf tenderness, SCDs on and working  Labs:     Latest Ref Rng & Units 10/25/2023    1:40 PM 03/06/2020   12:00 AM 01/09/2018    3:06 PM  CBC  WBC 4.0 - 10.5 K/uL 11.8  7.6  6.7   Hemoglobin 12.0 - 15.0 g/dL 40.9  81.1  91.4   Hematocrit 36.0 - 46.0 % 37.2  41.6  39.1   Platelets 150 - 400 K/uL 220  207  251      A/P: Patient is a 38 y.o. [id number] PPD#1 s/p SVD.  - Pain well controlled  - GU: UOP is adequate - GI: Tolerating regular diet - Activity: encouraged sitting up to chair and ambulation as tolerated - DVT Prophylaxis: SCDs, Ambulation - Labs: stable as above  Anxiety/depression - Zoloft 50mg  daily - Will arrange 1 week EPDS  Elevated blood pressures - Mild range Bps noted after delivery, likely pain related - Has remained normotensive since - Will arrange 1 week BP check  Circumcision Consent:  Routine circumcisions performed on newborns have been identified as voluntary, elective procedures by [employer] such as the [employer] [facility] [employer].  It is considered an elective procedure with no definitive medical  indication and carries risks. Risks include but are not limited to bleeding, infection, damage to penis with possible need for further surgery, poor cosmesis, and local anesthetic risks. Circumcision will only be performed if patient is deemed to have normal anatomy by his Pediatrician, meets adequate criteria for a newborn of similar gestational age after birth and is without infection or other medical issue contraindicating an elective procedure. Patient understands and agrees. Patient discussed with parents of infant.  Disposition:  D/C home tomorrow.   Steva Ready, DO

## 2023-10-26 NOTE — Lactation Note (Signed)
 This note was copied from a baby's chart. Lactation Consultation Note  Patient Name: Allison King FAOZH'Y Date: 10/26/2023 Age:37 hours Reason for consult: Initial assessment;Early term 37-38.6wks Per MOB, she feels infant is BF well, infant already BF 4x since birth and most feedings are 15 minutes or longer in length. Per MOB, she had latch difficulties with her 1st child and stopped BF after 2 weeks postpartum, MOB goal is to BF longer with her 2nd child. MOB will continue to BF infant by cues, on demand, every 2-3 hours, skin to skin. MOB knows to call for latch assistance if needed. LC discussed infant's input and output, the importance of maternal rest, balance meals and snacks, hydration. MOB was  made aware of O/P services, breastfeeding support groups, community resources, and our phone # for post-discharge questions.    Maternal Data Has patient been taught Hand Expression?: Yes  Feeding Mother's Current Feeding Choice: Breast Milk  LATCH Score  LC did not observe latch due infant recently BF less than 2 hours ago and currently asleep in his basinet.                   Lactation Tools Discussed/Used    Interventions Interventions: Breast feeding basics reviewed;Skin to skin;Position options;Hand express;Education;LC Services brochure  Discharge Pump: DEBP;Personal  Consult Status Consult Status: Follow-up Date: 10/26/23 Follow-up type: In-patient    Frederico Hamman 10/26/2023, 2:30 AM

## 2023-10-27 LAB — COMPREHENSIVE METABOLIC PANEL
ALT: 44 U/L (ref 0–44)
AST: 38 U/L (ref 15–41)
Albumin: 1.9 g/dL — ABNORMAL LOW (ref 3.5–5.0)
Alkaline Phosphatase: 185 U/L — ABNORMAL HIGH (ref 38–126)
Anion gap: 8 (ref 5–15)
BUN: 10 mg/dL (ref 6–20)
CO2: 22 mmol/L (ref 22–32)
Calcium: 8.1 mg/dL — ABNORMAL LOW (ref 8.9–10.3)
Chloride: 106 mmol/L (ref 98–111)
Creatinine, Ser: 0.77 mg/dL (ref 0.44–1.00)
GFR, Estimated: >60 mL/min (ref >60)
Glucose, Bld: 109 mg/dL — ABNORMAL HIGH (ref 70–99)
Potassium: 4.4 mmol/L (ref 3.5–5.1)
Sodium: 136 mmol/L (ref 135–145)
Total Bilirubin: <0.2 mg/dL (ref 0.0–1.2)
Total Protein: 5 g/dL — ABNORMAL LOW (ref 6.5–8.1)

## 2023-10-27 LAB — CBC
HCT: 34.3 % — ABNORMAL LOW (ref 36.0–46.0)
Hemoglobin: 10.7 g/dL — ABNORMAL LOW (ref 12.0–15.0)
MCH: 27.2 pg (ref 26.0–34.0)
MCHC: 31.2 g/dL (ref 30.0–36.0)
MCV: 87.1 fL (ref 80.0–100.0)
Platelets: 195 10*3/uL (ref 150–400)
RBC: 3.94 MIL/uL (ref 3.87–5.11)
RDW: 13.7 % (ref 11.5–15.5)
WBC: 11.2 10*3/uL — ABNORMAL HIGH (ref 4.0–10.5)
nRBC: 0 % (ref 0.0–0.2)

## 2023-10-27 LAB — LACTATE DEHYDROGENASE: LDH: 181 U/L (ref 98–192)

## 2023-10-27 MED ORDER — IBUPROFEN 600 MG PO TABS: 600.0000 mg | ORAL_TABLET | Freq: Four times a day (QID) | ORAL | 0 refills | Status: AC | PRN

## 2023-10-27 NOTE — Discharge Summary (Signed)
 Postpartum Discharge Summary     Patient Name: Allison King DOB: April 07, 1986 MRN: [id number]  Date of admission: 10/25/2023 Delivery date:10/25/2023 Delivering provider: Lavonda Jumbo Date of discharge: 10/27/2023  Admitting diagnosis: Normal labor [O80, Z37.9] Intrauterine pregnancy: [redacted]w[redacted]d     Secondary diagnosis:  Principal Problem:   Normal labor  Additional problems: AMA, Hx of fetal CPAM with prior pregnancy, Anxiety/Depression   Discharge diagnosis: Term Pregnancy Delivered Anxiety/Depression Elevated blood pressures                                       Post partum procedures: n/a Augmentation: N/A Complications: None  Hospital course: Onset of Labor With Vaginal Delivery      38 y.o. yo [id number] at [redacted]w[redacted]d was admitted in Active Labor on 10/25/2023. Labor course was uncomplicated Membrane Rupture Time/Date: 5:15 PM,10/25/2023  Delivery Method:Vaginal, Spontaneous Operative Delivery:N/A Episiotomy: None Lacerations:  1st degree;Perineal Patient had a postpartum course complicated by elevated blood pressure readings immediately postpartum with mild range pressures; this did not require medication. Today she has remained normotensive.  She is ambulating, tolerating a regular diet, passing flatus, and urinating well. Patient is discharged home in stable condition on 10/27/23.  Newborn Data: Birth date:10/25/2023 Birth time:6:32 PM Gender:Female Living status:Living Apgars:9 ,9  Weight:3402 g  Magnesium Sulfate received: No BMZ received: No Rhophylac:No MMR:No T-DaP:Given prenatally Flu: N/A RSV Vaccine received: No Transfusion:No Immunizations administered: Immunization History  Administered Date(s) Administered   Influenza-Unspecified 04/21/2018   Tdap 01/22/2018    Physical exam  Vitals:   10/26/23 0500 10/26/23 1359 10/26/23 2224 10/27/23 0500  BP: 119/72 116/79 121/63 129/60  Pulse: 63 67 73 73  Resp: 18 17 18 18   Temp: 97.8 F (36.6 C) 98  F (36.7 C)    TempSrc: Oral Oral    SpO2:  98% 97% 99%  Weight:      Height:       General: alert, cooperative, and no distress Lochia: appropriate Uterine Fundus: firm Incision: N/A DVT Evaluation: No evidence of DVT seen on physical exam. Negative Homan's sign. No cords or calf tenderness. No significant calf/ankle edema. Labs: Lab Results  Component Value Date   WBC 11.2 (H) 10/27/2023   HGB 10.7 (L) 10/27/2023   HCT 34.3 (L) 10/27/2023   MCV 87.1 10/27/2023   PLT 195 10/27/2023      Latest Ref Rng & Units 10/27/2023    5:22 AM  CMP  Glucose 70 - 99 mg/dL 784   BUN 6 - 20 mg/dL 10   Creatinine 6.96 - 1.00 mg/dL 2.95   Sodium 284 - 132 mmol/L 136   Potassium 3.5 - 5.1 mmol/L 4.4   Chloride 98 - 111 mmol/L 106   CO2 22 - 32 mmol/L 22   Calcium 8.9 - 10.3 mg/dL 8.1   Total Protein 6.5 - 8.1 g/dL 5.0   Total Bilirubin 0.0 - 1.2 mg/dL <4.4   Alkaline Phos 38 - 126 U/L 185   AST 15 - 41 U/L 38   ALT 0 - 44 U/L 44    Edinburgh Score:    10/26/2023    3:40 PM  Edinburgh Postnatal Depression Scale Screening Tool  I have been able to laugh and see the funny side of things. 1  I have looked forward with enjoyment to things. 1  I have blamed myself unnecessarily when things went wrong. 0  I have been anxious or worried for no good reason. 0  I have felt scared or panicky for no good reason. 0  Things have been getting on top of me. 1  I have been so unhappy that I have had difficulty sleeping. 0  I have felt sad or miserable. 1  I have been so unhappy that I have been crying. 1  The thought of harming myself has occurred to me. 0  Edinburgh Postnatal Depression Scale Total 5      After visit meds:  Allergies as of 10/27/2023       Reactions   Sulfa Antibiotics Swelling        Medication List     STOP taking these medications    aspirin 81 MG chewable tablet       TAKE these medications    ibuprofen 600 MG tablet Commonly known as: ADVIL Take 1  tablet (600 mg total) by mouth every 6 (six) hours as needed for cramping or moderate pain (pain score 4-6).   prenatal multivitamin Tabs tablet Take 1 tablet by mouth daily at 12 noon.   sertraline 50 MG tablet Commonly known as: ZOLOFT Take 50 mg by mouth daily.         Discharge home in stable condition Infant Feeding: Breast Infant Disposition:home with mother Discharge instruction: per After Visit Summary and Postpartum booklet. Activity: Advance as tolerated. Pelvic rest for 6 weeks.  Diet: routine diet Anticipated Birth Control: Unsure Postpartum Appointment:6 weeks Additional Postpartum F/U: Postpartum Depression checkup and BP check 1 week Future Appointments:No future appointments. Follow up Visit:  Follow-up Information     Steva Ready, DO Follow up in 6 week(s).   Specialty: Obstetrics and Gynecology Why: Our office will call to schedule a 1 week blood pressure check and your 6 week postpartum visit. Contact information: [address] [place] [address] [phone]                     10/27/2023 Lavonda Jumbo, DO

## 2023-10-27 NOTE — Lactation Note (Signed)
 This note was copied from a baby's chart. Lactation Consultation Note  Patient Name: Allison King Date: 10/27/2023 Age:37 hours  Reason for consult: Follow-up assessment;Infant weight loss;Early term 37-38.6wks  P2, [redacted]w[redacted]d, 7.7% weight loss  Baby is in the [facility] for his circumcision. Mother states baby is breastfeeding well. She has supplemented with  formula twice due to concerns of milk volume at breast and cluster feeding last night.   Mother reports a very limited breastfeeding duration with her first child due to a suspected tongue restriction. Mother pumped for 6 weeks. She is receptive to have OP lactation consultation for further breastfeeding support. Referral was made.     Feeding Mother's Current Feeding Choice: Breast Milk and Formula       Discharge Discharge Education: Engorgement and breast care;Warning signs for feeding baby;Outpatient recommendation;Outpatient Epic message sent  Consult Status Consult Status: Complete Date: 10/27/23    Omar Person 10/27/2023, 12:57 PM

## 2023-11-03 ENCOUNTER — Telehealth (HOSPITAL_COMMUNITY): Payer: Self-pay

## 2023-11-03 NOTE — Telephone Encounter (Signed)
 11/03/2023 1743  Name: Myeshia Fojtik MRN: [id number] DOB: 05/08/1986  Reason for Call:  Transition of Care Hospital Discharge Call  Contact Status: Patient Contact Status: Message  Language assistant needed:          Follow-Up Questions:    Inocente Salles Postnatal Depression Scale:  In the Past 7 Days:    PHQ2-9 Depression Scale:     Discharge Follow-up:    Post-discharge interventions: NA  Signature  Signe Colt
